# Patient Record
Sex: Male | Born: 1945 | Race: White | Hispanic: No | Marital: Single | Smoking: Never smoker
Health system: Southern US, Community
[De-identification: ages and names within clinical notes are randomized; demographics above are authoritative.]

## PROBLEM LIST (undated history)

## (undated) DIAGNOSIS — C801 Malignant (primary) neoplasm, unspecified: Secondary | ICD-10-CM

## (undated) DIAGNOSIS — J8410 Pulmonary fibrosis, unspecified: Secondary | ICD-10-CM

## (undated) DIAGNOSIS — M109 Gout, unspecified: Secondary | ICD-10-CM

## (undated) DIAGNOSIS — I2510 Atherosclerotic heart disease of native coronary artery without angina pectoris: Secondary | ICD-10-CM

## (undated) DIAGNOSIS — C099 Malignant neoplasm of tonsil, unspecified: Secondary | ICD-10-CM

## (undated) DIAGNOSIS — R1310 Dysphagia, unspecified: Secondary | ICD-10-CM

## (undated) DIAGNOSIS — K599 Functional intestinal disorder, unspecified: Secondary | ICD-10-CM

## (undated) HISTORY — PX: PEG TUBE PLACEMENT: SUR1034

## (undated) HISTORY — PX: ABDOMINAL AORTIC ENDOVASCULAR STENT GRAFT: SHX5707

## (undated) HISTORY — PX: CORONARY ARTERY ANGIOPLASTY: PR CATH30428

---

## 2009-02-21 ENCOUNTER — Emergency Department
Admit: 2009-02-21 | Discharge: 2009-02-21 | Disposition: A | Discharge status: Home / Self Care | Payer: Self-pay | Source: Ambulatory Visit | Attending: EXTERNAL | Admitting: EXTERNAL

## 2011-08-06 ENCOUNTER — Ambulatory Visit (INDEPENDENT_AMBULATORY_CARE_PROVIDER_SITE_OTHER): Payer: Self-pay | Admitting: Surgery

## 2011-08-27 ENCOUNTER — Ambulatory Visit (INDEPENDENT_AMBULATORY_CARE_PROVIDER_SITE_OTHER): Payer: 59 | Admitting: GENERAL SURGERY

## 2011-08-27 ENCOUNTER — Ambulatory Visit (INDEPENDENT_AMBULATORY_CARE_PROVIDER_SITE_OTHER): Payer: Self-pay | Admitting: Surgery

## 2011-08-27 NOTE — Progress Notes (Signed)
 This appointment was made on 08/06/11    The patient is an inmate at [facility]. He has a history of gout. He states that about four to five weeks ago both of his great toes were red, hot, and swollen at the PIP joints. He states he could feel pus in them. He has been on allopurinol since that time, and they have improved significantly over the past several days.     On exam, he has some mild swelling of the PIP joints bilaterally. There is no warmth, no fluctuance, no drainage. The areas are not tender to touch.     I explained to Jose Duran that gout often appears like an infection, and it appears that the allopurinol has done its job.     There is nothing to incise or drain. There appears to be no acute infection.     We will see the patient on an as needed basis. Total time with patient, 10 minutes

## 2018-07-02 ENCOUNTER — Encounter (HOSPITAL_COMMUNITY): Payer: Self-pay | Admitting: Emergency Medicine

## 2018-07-02 ENCOUNTER — Emergency Department (HOSPITAL_COMMUNITY)
Admission: EM | Admit: 2018-07-02 | Discharge: 2018-07-02 | Disposition: A | Discharge status: Home / Self Care | Payer: Medicare Other | Attending: Emergency Medicine | Admitting: Emergency Medicine

## 2018-07-02 DIAGNOSIS — Z931 Gastrostomy status: Secondary | ICD-10-CM | POA: Diagnosis not present

## 2018-07-02 DIAGNOSIS — R079 Chest pain, unspecified: Secondary | ICD-10-CM | POA: Insufficient documentation

## 2018-07-02 DIAGNOSIS — Y998 Other external cause status: Secondary | ICD-10-CM | POA: Insufficient documentation

## 2018-07-02 DIAGNOSIS — Y929 Unspecified place or not applicable: Secondary | ICD-10-CM | POA: Diagnosis not present

## 2018-07-02 DIAGNOSIS — Y939 Activity, unspecified: Secondary | ICD-10-CM | POA: Diagnosis not present

## 2018-07-02 DIAGNOSIS — Z5321 Procedure and treatment not carried out due to patient leaving prior to being seen by health care provider: Secondary | ICD-10-CM | POA: Insufficient documentation

## 2018-07-02 HISTORY — DX: Gout, unspecified: M10.9

## 2018-07-02 HISTORY — DX: Atherosclerotic heart disease of native coronary artery without angina pectoris: I25.10

## 2018-07-02 NOTE — ED Triage Notes (Signed)
Pt ambulatory and requesting to leave ... [employer] aware and giving him a citation, pt not in custody
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
## 2018-07-02 NOTE — ED Notes (Signed)
No answer x2 vitals and never answered for xray

## 2019-03-02 ENCOUNTER — Encounter (HOSPITAL_BASED_OUTPATIENT_CLINIC_OR_DEPARTMENT_OTHER): Payer: Self-pay

## 2019-03-02 ENCOUNTER — Emergency Department (HOSPITAL_BASED_OUTPATIENT_CLINIC_OR_DEPARTMENT_OTHER)
Admission: EM | Admit: 2019-03-02 | Discharge: 2019-03-02 | Disposition: A | Discharge status: Home / Self Care | Payer: Medicare HMO | Attending: Emergency Medicine | Admitting: Emergency Medicine

## 2019-03-02 ENCOUNTER — Other Ambulatory Visit: Payer: Self-pay

## 2019-03-02 DIAGNOSIS — Z7982 Long term (current) use of aspirin: Secondary | ICD-10-CM | POA: Insufficient documentation

## 2019-03-02 DIAGNOSIS — Z4659 Encounter for fitting and adjustment of other gastrointestinal appliance and device: Secondary | ICD-10-CM | POA: Insufficient documentation

## 2019-03-02 DIAGNOSIS — Z79899 Other long term (current) drug therapy: Secondary | ICD-10-CM | POA: Insufficient documentation

## 2019-03-02 HISTORY — DX: Malignant (primary) neoplasm, unspecified (CMS HCC): C80.1

## 2019-03-02 HISTORY — DX: Malignant (primary) neoplasm, unspecified: C80.1

## 2019-03-02 MED ORDER — CEPHALEXIN 500 MG CAPSULE
500.00 mg | ORAL_CAPSULE | Freq: Four times a day (QID) | ORAL | 0 refills | Status: AC
Start: 2019-03-02 — End: 2019-03-09

## 2019-03-02 MED ORDER — CEPHALEXIN 500 MG CAPSULE
500.00 mg | ORAL_CAPSULE | Freq: Four times a day (QID) | ORAL | 0 refills | Status: DC
Start: 2019-03-02 — End: 2019-03-02

## 2019-03-02 NOTE — ED Provider Notes (Signed)
Jose Duran, [facility]  Emergency Department Visit Note    Date:  03/02/2019  Primary care provider:  No Pcp  Means of arrival:  private car  History obtained from: patient  History limited by: none    Chief Complaint:  Feeding Tube Problem     HISTORY OF PRESENT ILLNESS     Jose Duran, date of birth February 28, 1946, is a 73 y.o. male who presents to the Emergency Department complaining of a feeding tube problem with an onset of day prior to arrival. Patient reports the tube had fallen out and has concern for infection to the surrounding skin. Patient notes he has had feeding tube placement for years and uses the tube daily. He denies measured fevers, chest pain, shortness of breath, abdominal pain, vomiting and nausea.     REVIEW OF SYSTEMS     The pertinent positive and negative symptoms are as per HPI. All other systems reviewed and are negative.     PATIENT HISTORY     Past Medical History:  Past Medical History:   Diagnosis Date   . Cancer (CMS [facility])        Past Surgical History:  Past Surgical History:   Procedure Laterality Date   . Coronary artery angioplasty         Family History:  No history of acute family illness given at this time.     Social History:  Social History     Tobacco Use   . Smoking status: Never Smoker   . Smokeless tobacco: Never Used   Substance Use Topics   . Alcohol use: Not Currently   . Drug use: Not Currently     Social History     Substance and Sexual Activity   Drug Use Not Currently       Medications:  Current Outpatient Medications   Medication Sig   . ALLOPURINOL ORAL Take by mouth   . aspirin 81 mg Oral Tablet, Chewable Take 81 mg by mouth Once a day   . esomeprazole magnesium (NEXIUM ORAL) Take by mouth       Allergies:  No Known Allergies    PHYSICAL EXAM     Vitals:  Filed Vitals:    03/02/19 1403   BP: 122/68   Pulse: 77   Resp: 18   Temp: 37.1 C (98.7 F)   SpO2: 97%       Pulse ox  97% on None (Room Air) interpreted by me as:  Normal    Constitutional: The patient is in no acute distress.  Head: Atraumatic, normocephalic  Eyes: Pupils equal and round, reactive to light and accomodation. Extraocular muscles intact bilaterally, conjunctiva and sclera clear  Oropharynx: Moist mucous membranes, no lesions, erythema or exudate noted.  Neck: Soft, supple, no palpatory masses, lymphadenopathy, carotid bruits, tenderness or JVD.  Chest: Atraumatic, no tenderness to palpation, clear and equal breath sounds bilaterally. No wheezes, rhonchi or rales.  No accessory muscle use.  Cardiovascular: Heart is S1-S2, regular rate and rhythm without murmur, click, gallop or rub.  Abdomen: G Tube Ostomy with mild prolapse of underlying tissues. Soft, non-tender, non-distended without evidence of rebound or guarding, active bowel sound in four quadrants, no hepatomegaly, splenomegaly or other palpatory masses.   Skin: No cyanosis, jaundice, rash, lesion or edema.  Neurologic: Alert, oriented, muscle strength 5/5 bilateral upper and lower extremities, sensation equal in bilateral upper and lower extremities. No facial asymmetry. No slurred speech.  Musculoskeletal:  no deformities, trauma, edema, no midline or paraspinal muscle tenderness to palpation. No step-off in midline spine.  Vascular: Peripheral pulses 2/4 with brisk capillary refills of less than 3 seconds.    DIAGNOSTIC STUDIES     Procedures:     Encounter Date: 03/02/2019     Emergency Department Procedure:    Gastrostomy Tube Change  Procedure performed at: 8676  Documentation: Area around the cystostomy was prepped, Balloon tested and was intact, Catheter was lubricated, Inserted through the gastrostomy with, no difficulty, Gastric fluid came back through the catheter, Catheter could be irrigated easily  Tube type: G tube with bolster  Tube size: 20 fr    ED PROGRESS NOTE / [place] records reviewed by me:  I have reviewed the nurse's notes. I have reviewed the patient's  problem list and pertinent past medical records.     No orders of the defined types were placed in this encounter.       15:25: Initial evaluation is complete at this time. Plan will be to replace the G Tube and follow up with general surgery. Patient is agreeable with the treatment plan at this time.      15:38: At bedside for feeding tube placement.     15:46: Gastrostomy tube placed at bedside without complication or difficulty. Able to flush and aspirate without difficulty. See procedure note.     15:48: On recheck, the patient is in no acute distress.  I encouraged follow-up with his general surgeon back home because he does have a mild prolapse around his ostomy site.  Patient is to follow up with Surgeon and Family Doctor as soon as possible. I discussed the diagnosis, disposition, and follow-up plan. The patient understood and is in accordance with the treatment plan at this time. Patient is to return here to the Emergency Department if new or worsening symptoms appear. All of their questions have been answered to their satisfaction. The patient is in stable condition at the time of discharge.     MIPS     Not applicable     OPIATE PRESCRIPTION      Not applicable    CORE MEASURES     Not applicable    CRITICAL CARE TIME     Not applicable     PRE-DISPOSITION VITALS        Pre-Disposition Vitals:  Filed Vitals:    03/02/19 1403   BP: 122/68   Pulse: 77   Resp: 18   Temp: 37.1 C (98.7 F)   SpO2: 97%       CLINICAL IMPRESSION     Encounter Diagnosis   Name Primary?   . Encounter for feeding tube placement Yes       DISPOSITION/PLAN     Discharged        Follow-Up:     Your surgeon and family doctor      Call as soon as possible for follow up in 2 days      Condition at Disposition: Stable        SCRIBE Acequia. Detrick, SCRIBE scribed for Natalia Leatherwood, DO on 03/02/2019 at 3:21 PM.     Documentation assistance provided for Jose Duran, Zella Richer, DO  by [employer] [place], [place].  Information recorded by the scribe was done at my direction and has been reviewed and validated by me Jose Duran, Zella Richer, DO.

## 2019-03-02 NOTE — Discharge Instructions (Signed)
Return to ED, as we discussed, with any worsening symptoms, new concerns or inability to follow up as instructed in chart.

## 2019-03-02 NOTE — ED Triage Notes (Signed)
Patient states feeding tube came out this am.  Needs replaced.

## 2019-03-02 NOTE — ED Nurses Note (Signed)
Patient discharged home with family.  AVS reviewed with patient/care giver.  A written copy of the AVS and discharge instructions was given to the patient/care giver.  Questions sufficiently answered as needed.  Patient/care giver encouraged to follow up with PCP as indicated.  In the event of an emergency, patient/care giver instructed to call 911 or go to the nearest emergency room.

## 2020-08-13 ENCOUNTER — Encounter (HOSPITAL_BASED_OUTPATIENT_CLINIC_OR_DEPARTMENT_OTHER): Payer: Self-pay | Admitting: Emergency Medicine

## 2020-08-13 ENCOUNTER — Other Ambulatory Visit: Payer: Self-pay

## 2020-08-13 ENCOUNTER — Emergency Department (HOSPITAL_BASED_OUTPATIENT_CLINIC_OR_DEPARTMENT_OTHER)
Admission: EM | Admit: 2020-08-13 | Discharge: 2020-08-13 | Disposition: A | Discharge status: Home / Self Care | Payer: Medicare Other | Attending: Emergency Medicine | Admitting: Emergency Medicine

## 2020-08-13 DIAGNOSIS — L03114 Cellulitis of left upper limb: Secondary | ICD-10-CM | POA: Diagnosis not present

## 2020-08-13 DIAGNOSIS — I2510 Atherosclerotic heart disease of native coronary artery without angina pectoris: Secondary | ICD-10-CM | POA: Insufficient documentation

## 2020-08-13 DIAGNOSIS — M79642 Pain in left hand: Secondary | ICD-10-CM | POA: Diagnosis present

## 2020-08-13 MED ORDER — DOXYCYCLINE HYCLATE 100 MG PO CAPS: 100.0000 mg | ORAL_CAPSULE | Freq: Two times a day (BID) | ORAL | 0 refills | Status: DC

## 2020-08-13 MED ORDER — DOXYCYCLINE HYCLATE 100 MG PO TABS
100.0000 mg | ORAL_TABLET | Freq: Once | ORAL | Status: AC
  Administered 2020-08-13: 100 mg via ORAL
  Filled 2020-08-13: qty 1

## 2020-08-13 MED ORDER — ALLOPURINOL 300 MG PO TABS: 300.0000 mg | ORAL_TABLET | Freq: Every day | ORAL | 0 refills | Status: DC
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
## 2020-08-13 NOTE — ED Notes (Signed)
Doxycycline given through Gtube because patient is NPO

## 2020-08-13 NOTE — ED Provider Notes (Signed)
[facility] EMERGENCY DEPARTMENT Provider Note   CSN: [id number] Arrival date & time: 08/13/20  0340     History Chief Complaint  Patient presents with  . Arm Pain    Marc Rubio is a 74 y.o. male.  74 yo M with a chief complaints of left hand pain.  This been going on for a few days now.  Patient states that he hurt it trying to get something out of an SUV.  Had been seen at an outside hospital and had a plain film performed as an order through triage that was negative for fracture.  He then left prior to seeing a provider.  Since then he does not feel like his symptoms of gotten much better or worse.  Denies repeat trauma.  Pain worse to the dorsum of the hand.  Able to range the wrist somewhat.  He has been applying ice and cold water off and on.  Has tried Epson salt soaks as well.  The history is provided by the patient.  Arm Pain This is a new problem. The current episode started more than 2 days ago. The problem occurs constantly. The problem has not changed since onset.Pertinent negatives include no chest pain, no abdominal pain, no headaches and no shortness of breath. Nothing aggravates the symptoms. Nothing relieves the symptoms. He has tried nothing for the symptoms. The treatment provided no relief.       Past Medical History:  Diagnosis Date  . Coronary artery disease   . Gout     There are no problems to display for this patient.   Past Surgical History:  Procedure Laterality Date  . PEG TUBE PLACEMENT         Family History  Problem Relation Age of Onset  . Cancer Sister   . Cancer Brother     Social History   Tobacco Use  . Smoking status: Never Smoker  . Smokeless tobacco: Never Used  Vaping Use  . Vaping Use: Never used  Substance Use Topics  . Alcohol use: Not Currently  . Drug use: Not Currently    Home Medications Prior to Admission medications   Medication Sig Start Date End Date Taking? Authorizing Provider   allopurinol (ZYLOPRIM) 300 MG tablet Take 1 tablet (300 mg total) by mouth daily. 08/13/20   Deno Etienne, DO  doxycycline (VIBRAMYCIN) 100 MG capsule Take 1 capsule (100 mg total) by mouth 2 (two) times daily. One po bid x 7 days 08/13/20   Deno Etienne, DO    Allergies    Patient has no known allergies.  Review of Systems   Review of Systems  Constitutional: Negative for chills and fever.  HENT: Negative for congestion and facial swelling.   Eyes: Negative for discharge and visual disturbance.  Respiratory: Negative for shortness of breath.   Cardiovascular: Negative for chest pain and palpitations.  Gastrointestinal: Negative for abdominal pain, diarrhea and vomiting.  Musculoskeletal: Positive for myalgias. Negative for arthralgias.  Skin: Positive for color change. Negative for rash.  Neurological: Negative for tremors, syncope and headaches.  Psychiatric/Behavioral: Negative for confusion and dysphoric mood.    Physical Exam Updated Vital Signs BP (!) 125/59 (BP Location: Right Arm)   Pulse 87   Temp 98.3 F (36.8 C) (Oral)   Resp 20   Ht 6' (1.829 m)   Wt 79.4 kg   SpO2 100%   BMI 23.73 kg/m   Physical Exam Vitals and nursing note reviewed.  Constitutional:  Appearance: He is well-developed.  HENT:     Head: Normocephalic and atraumatic.  Eyes:     Pupils: Pupils are equal, round, and reactive to light.  Neck:     Vascular: No JVD.  Cardiovascular:     Rate and Rhythm: Normal rate and regular rhythm.     Heart sounds: No murmur heard.  No friction rub. No gallop.   Pulmonary:     Effort: No respiratory distress.     Breath sounds: No wheezing.  Abdominal:     General: There is no distension.     Tenderness: There is no guarding or rebound.  Musculoskeletal:        General: Swelling and tenderness present. Normal range of motion.     Cervical back: Normal range of motion and neck supple.     Comments: Pain and swelling to the dorsum of the left hand.  No  focal area of fluctuance or induration.  Able to range the wrist without significant discomfort.  Warm to touch.  Pulse motor and sensation intact distally.  Skin:    Coloration: Skin is not pale.     Findings: No rash.  Neurological:     Mental Status: He is alert and oriented to person, place, and time.  Psychiatric:        Behavior: Behavior normal.     ED Results / Procedures / Treatments   Labs (all labs ordered are listed, but only abnormal results are displayed) Labs Reviewed - No data to display  EKG None  Radiology No results found.  Procedures Procedures (including critical care time)  Medications Ordered in ED Medications  doxycycline (VIBRA-TABS) tablet 100 mg (has no administration in time range)    ED Course  I have reviewed the triage vital signs and the nursing notes.  Pertinent labs & imaging results that were available during my care of the patient were reviewed by me and considered in my medical decision making (see chart for details).    MDM Rules/Calculators/A&P                          74 yo M with a chief complaints of left hand pain.  Going on for a few days now.  Has been seen at an outside hospital with a negative plain film.  Septic arthritis is less likely as the patient has no significant pain at the wrist.  Seems focally along the dorsum of the hand.  Will start on antibiotics.  Splint for comfort.  PCP follow-up.  4:13 AM:  I have discussed the diagnosis/risks/treatment options with the patient and believe the pt to be eligible for discharge home to follow-up with PCP. We also discussed returning to the ED immediately if new or worsening sx occur. We discussed the sx which are most concerning (e.g., sudden worsening pain, fever, inability to tolerate by mouth) that necessitate immediate return. Medications administered to the patient during their visit and any new prescriptions provided to the patient are listed below.  Medications given  during this visit Medications  doxycycline (VIBRA-TABS) tablet 100 mg (has no administration in time range)     The patient appears reasonably screen and/or stabilized for discharge and I doubt any other medical condition or other [facility] requiring further screening, evaluation, or treatment in the ED at this time prior to discharge.   Final Clinical Impression(s) / ED Diagnoses Final diagnoses:  Cellulitis of left upper extremity    Rx /  DC Orders ED Discharge Orders         Ordered    doxycycline (VIBRAMYCIN) 100 MG capsule  2 times daily        08/13/20 0410    allopurinol (ZYLOPRIM) 300 MG tablet  Daily        08/13/20 0410           Deno Etienne, DO 08/13/20 0413

## 2020-08-13 NOTE — Discharge Instructions (Signed)
Warm compresses at least 4 times a day.  Return for rapid spreading redness or fever.

## 2020-11-25 ENCOUNTER — Encounter (HOSPITAL_BASED_OUTPATIENT_CLINIC_OR_DEPARTMENT_OTHER): Payer: Self-pay

## 2020-11-25 ENCOUNTER — Other Ambulatory Visit: Payer: Self-pay

## 2020-11-25 ENCOUNTER — Emergency Department (HOSPITAL_BASED_OUTPATIENT_CLINIC_OR_DEPARTMENT_OTHER): Payer: Medicare Other

## 2020-11-25 ENCOUNTER — Emergency Department (HOSPITAL_BASED_OUTPATIENT_CLINIC_OR_DEPARTMENT_OTHER)
Admission: EM | Admit: 2020-11-25 | Discharge: 2020-11-25 | Disposition: A | Discharge status: Home / Self Care | Payer: Medicare Other | Attending: Emergency Medicine | Admitting: Emergency Medicine

## 2020-11-25 DIAGNOSIS — Z20822 Contact with and (suspected) exposure to covid-19: Secondary | ICD-10-CM | POA: Diagnosis not present

## 2020-11-25 DIAGNOSIS — M109 Gout, unspecified: Secondary | ICD-10-CM | POA: Insufficient documentation

## 2020-11-25 DIAGNOSIS — I2510 Atherosclerotic heart disease of native coronary artery without angina pectoris: Secondary | ICD-10-CM | POA: Diagnosis not present

## 2020-11-25 DIAGNOSIS — R600 Localized edema: Secondary | ICD-10-CM | POA: Diagnosis not present

## 2020-11-25 DIAGNOSIS — R0602 Shortness of breath: Secondary | ICD-10-CM | POA: Diagnosis present

## 2020-11-25 DIAGNOSIS — J40 Bronchitis, not specified as acute or chronic: Secondary | ICD-10-CM | POA: Insufficient documentation

## 2020-11-25 LAB — COMPREHENSIVE METABOLIC PANEL
ALT: 85 U/L — ABNORMAL HIGH (ref 0–44)
AST: 46 U/L — ABNORMAL HIGH (ref 15–41)
Albumin: 2.7 g/dL — ABNORMAL LOW (ref 3.5–5.0)
Alkaline Phosphatase: 97 U/L (ref 38–126)
Anion gap: 10 (ref 5–15)
BUN: 27 mg/dL — ABNORMAL HIGH (ref 8–23)
CO2: 27 mmol/L (ref 22–32)
Calcium: 8.7 mg/dL — ABNORMAL LOW (ref 8.9–10.3)
Chloride: 100 mmol/L (ref 98–111)
Creatinine, Ser: 0.9 mg/dL (ref 0.61–1.24)
GFR, Estimated: >60 mL/min (ref >60)
Glucose, Bld: 107 mg/dL — ABNORMAL HIGH (ref 70–99)
Potassium: 3.9 mmol/L (ref 3.5–5.1)
Sodium: 137 mmol/L (ref 135–145)
Total Bilirubin: 0.2 mg/dL — ABNORMAL LOW (ref 0.3–1.2)
Total Protein: 7.7 g/dL (ref 6.5–8.1)

## 2020-11-25 LAB — URINALYSIS, ROUTINE W REFLEX MICROSCOPIC
Bilirubin Urine: NEGATIVE
Glucose, UA: NEGATIVE mg/dL
Hgb urine dipstick: NEGATIVE
Ketones, ur: NEGATIVE mg/dL
Leukocytes,Ua: NEGATIVE
Nitrite: NEGATIVE
Protein, ur: NEGATIVE mg/dL
Specific Gravity, Urine: 1.02 (ref 1.005–1.030)
pH: 6 (ref 5.0–8.0)

## 2020-11-25 LAB — CBC WITH DIFFERENTIAL/PLATELET
Abs Immature Granulocytes: 0.11 10*3/uL — ABNORMAL HIGH (ref 0.00–0.07)
Basophils Absolute: 0 10*3/uL (ref 0.0–0.1)
Basophils Relative: 0 %
Eosinophils Absolute: 0.1 10*3/uL (ref 0.0–0.5)
Eosinophils Relative: 1 %
HCT: 36 % — ABNORMAL LOW (ref 39.0–52.0)
Hemoglobin: 11.5 g/dL — ABNORMAL LOW (ref 13.0–17.0)
Immature Granulocytes: 1 %
Lymphocytes Relative: 9 %
Lymphs Abs: 0.8 10*3/uL (ref 0.7–4.0)
MCH: 31.5 pg (ref 26.0–34.0)
MCHC: 31.9 g/dL (ref 30.0–36.0)
MCV: 98.6 fL (ref 80.0–100.0)
Monocytes Absolute: 1.1 10*3/uL — ABNORMAL HIGH (ref 0.1–1.0)
Monocytes Relative: 13 %
Neutro Abs: 6 10*3/uL (ref 1.7–7.7)
Neutrophils Relative %: 76 %
Platelets: 704 10*3/uL — ABNORMAL HIGH (ref 150–400)
RBC: 3.65 MIL/uL — ABNORMAL LOW (ref 4.22–5.81)
RDW: 14.2 % (ref 11.5–15.5)
WBC: 8 10*3/uL (ref 4.0–10.5)
nRBC: 0 % (ref 0.0–0.2)

## 2020-11-25 LAB — RESP PANEL BY RT-PCR (FLU A&B, COVID) ARPGX2
Influenza A by PCR: NEGATIVE
Influenza B by PCR: NEGATIVE
SARS Coronavirus 2 by RT PCR: NEGATIVE

## 2020-11-25 LAB — D-DIMER, QUANTITATIVE: D-Dimer, Quant: 2.44 ug/mL-FEU — ABNORMAL HIGH (ref 0.00–0.50)

## 2020-11-25 LAB — TROPONIN I (HIGH SENSITIVITY): Troponin I (High Sensitivity): 4 ng/L (ref <18)

## 2020-11-25 LAB — BRAIN NATRIURETIC PEPTIDE: B Natriuretic Peptide: 62.7 pg/mL (ref 0.0–100.0)

## 2020-11-25 MED ORDER — METHYLPREDNISOLONE 4 MG PO TBPK: ORAL_TABLET | ORAL | 0 refills | Status: DC

## 2020-11-25 MED ORDER — PREDNISONE 20 MG PO TABS: 40.0000 mg | ORAL_TABLET | Freq: Every day | ORAL | 0 refills | Status: DC

## 2020-11-25 MED ORDER — IOHEXOL 350 MG/ML SOLN
100.0000 mL | Freq: Once | INTRAVENOUS | Status: AC | PRN
  Administered 2020-11-25: 100 mL via INTRAVENOUS

## 2020-11-25 MED ORDER — COLCHICINE 0.6 MG PO TABS: 0.6000 mg | ORAL_TABLET | Freq: Every day | ORAL | 0 refills | Status: DC

## 2020-11-25 MED ORDER — DOXYCYCLINE HYCLATE 100 MG PO CAPS: 100.0000 mg | ORAL_CAPSULE | Freq: Two times a day (BID) | ORAL | 0 refills | Status: DC

## 2020-11-25 NOTE — ED Notes (Signed)
Patient transported to CT 

## 2020-11-25 NOTE — ED Notes (Signed)
ED Provider at bedside. 

## 2020-11-25 NOTE — ED Provider Notes (Signed)
[place] EMERGENCY DEPARTMENT Provider Note   CSN: [id number] Arrival date & time: 11/25/20  1510     History Chief Complaint  Patient presents with  . Shortness of Breath    Marc Rubio is a 75 y.o. male.  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  He states that he feels like he got "hit by a truck".  He is not vaccinated for COVID.  Denies cough.  Denies any dysuria or hematuria.  He also complains of pain to his left wrist, which has been ongoing since December.  Prior chart review, patient was seen over [facility] after he injured it while pulling furniture, had a unremarkable x-ray.  He was also seen here at [facility] and was thought to have some cellulitis, given antibiotics.  Patient was compliant with these but states that his wrist has continued to be swollen and he does not have the ability to flex it.  States he does have a history of gout but it is usually in his feet.  He denies any vomiting, chest pain, dizziness.  He states the headaches come and go, no word slurring, facial droop.  He is not vaccinated for COVID-19.    Past Medical History:  Diagnosis Date  . Coronary artery disease   . Gout     There are no problems to display for this patient.   Past Surgical History:  Procedure Laterality Date  . PEG TUBE PLACEMENT         Family History  Problem Relation Age of Onset  . Cancer Sister   . Cancer Brother     Social History   Tobacco Use  . Smoking status: Never Smoker  . Smokeless tobacco: Never Used  Vaping Use  . Vaping Use: Never used  Substance Use Topics  . Alcohol use: Not Currently  . Drug use: Not Currently    Home Medications Prior to Admission medications   Medication Sig  Start Date End Date Taking? Authorizing Provider  colchicine 0.6 MG tablet Take 1 tablet (0.6 mg total) by mouth daily. 11/25/20 12/25/20 Yes Garald Balding, PA-C  doxycycline (VIBRAMYCIN) 100 MG capsule Take 1 capsule (100 mg total) by mouth 2 (two) times daily. 11/25/20  Yes Garald Balding, PA-C  methylPREDNISolone (MEDROL DOSEPAK) 4 MG TBPK tablet Take as directed until finished 11/25/20  Yes Claritza July A, PA-C  allopurinol (ZYLOPRIM) 300 MG tablet Take 1 tablet (300 mg total) by mouth daily. 08/13/20   Deno Etienne, DO    Allergies    Patient has no known allergies.  Review of Systems   Review of Systems  Constitutional: Positive for fatigue and fever. Negative for chills.  HENT: Negative for ear pain and sore throat.   Eyes: Negative for pain and visual disturbance.  Respiratory: Positive for shortness of breath. Negative for cough.   Cardiovascular: Negative for chest pain and palpitations.  Gastrointestinal: Positive for nausea. Negative for abdominal pain and vomiting.  Genitourinary: Negative for dysuria and hematuria.  Musculoskeletal: Negative for arthralgias and back pain.  Skin: Negative for color change and rash.  Neurological: Negative for seizures and syncope.  All other systems reviewed and are negative.   Physical Exam Updated Vital Signs BP 122/73   Pulse 89  Temp 98.3 F (36.8 C) (Oral)   Resp (!) 30   Ht 6' (1.829 m)   Wt 79.4 kg   SpO2 99%   BMI 23.73 kg/m   Physical Exam Vitals and nursing note reviewed.  Constitutional:      General: He is not in acute distress.    Appearance: He is well-developed. He is not ill-appearing, toxic-appearing or diaphoretic.  HENT:     Head: Normocephalic and atraumatic.  Eyes:     Conjunctiva/sclera: Conjunctivae normal.  Cardiovascular:     Rate and Rhythm: Normal rate and regular rhythm.     Heart sounds: No murmur heard.   Pulmonary:     Effort: Pulmonary effort is normal. No respiratory distress.      Breath sounds: Normal breath sounds. No decreased breath sounds or wheezing.  Chest:     Chest wall: No tenderness.  Abdominal:     Palpations: Abdomen is soft.     Tenderness: There is no abdominal tenderness.     Comments: Significantly distended abdomen, however nontender.  PEG tube in place, no surrounding erythema, drainage, warmth.  No flank tenderness.  Musculoskeletal:     Cervical back: Neck supple.     Right lower leg: Edema present.     Left lower leg: Edema present.     Comments: Trace edema to lower extremities bilaterally.  Left wrist with moderate edema, mildly warm to touch.  No overlying erythema, warmth.  Limited flexion and extension secondary to pain.  2+ radial pulses.<2 cap refill  Skin:    General: Skin is warm and dry.  Neurological:     General: No focal deficit present.     Mental Status: He is alert.  Psychiatric:        Mood and Affect: Mood normal.        Behavior: Behavior normal.     ED Results / Procedures / Treatments   Labs (all labs ordered are listed, but only abnormal results are displayed) Labs Reviewed  CBC WITH DIFFERENTIAL/PLATELET - Abnormal; Notable for the following components:      Result Value   RBC 3.65 (*)    Hemoglobin 11.5 (*)    HCT 36.0 (*)    Platelets 704 (*)    Monocytes Absolute 1.1 (*)    Abs Immature Granulocytes 0.11 (*)    All other components within normal limits  COMPREHENSIVE METABOLIC PANEL - Abnormal; Notable for the following components:   Glucose, Bld 107 (*)    BUN 27 (*)    Calcium 8.7 (*)    Albumin 2.7 (*)    AST 46 (*)    ALT 85 (*)    Total Bilirubin 0.2 (*)    All other components within normal limits  D-DIMER, QUANTITATIVE - Abnormal; Notable for the following components:   D-Dimer, Quant 2.44 (*)    All other components within normal limits  RESP PANEL BY RT-PCR (FLU A&B, COVID) ARPGX2  BRAIN NATRIURETIC PEPTIDE  URINALYSIS, ROUTINE W REFLEX MICROSCOPIC  TROPONIN I (HIGH SENSITIVITY)     EKG EKG Interpretation  Date/Time:  Monday November 25 2020 15:30:49 EDT Ventricular Rate:  97 PR Interval:  134 QRS Duration: 86 QT Interval:  364 QTC Calculation: 462 R Axis:   4 Text Interpretation: Normal sinus rhythm Cannot rule out Anterior infarct , age undetermined Abnormal ECG No significant change since last tracing Confirmed by Calvert Cantor [phone]) on 11/25/2020 3:36:59 PM   Radiology DG Abdomen 1 View  Result Date:  11/25/2020 CLINICAL DATA:  Shortness of breath, fever, nausea and intermittent headache for 2 weeks EXAM: ABDOMEN - 1 VIEW COMPARISON:  07/17/2017 Correlation: CT abdomen pelvis 08/11/2017 FINDINGS: Gastrostomy tube in LEFT upper quadrant. Colonic dilatation involving the cecum through transverse colon, measuring up to 14.6 cm at proximal ascending colon; previously this measured 14.8 cm in 2018. Small amount of gas within rectum and sigmoid colon, which appear decompressed. No small bowel distension. Stents identified within abdominal aorta and common iliac arteries. Osseous demineralization with degenerative disc disease changes of thoracolumbar spine and chronic appearing compression deformity of L4 vertebral body. IMPRESSION: Colonic distension of cecum through transverse colon, measuring up to 14.6 cm at proximal ascending colon, not significantly changed from 07/17/2017 exam; the colon was decompressed on an interval CT study from 2019. This likely reflects colonic ileus since no obstructing colonic lesion was seen on the interval CT exam, which showed a decompressed colon. Electronically Signed   By: Lavonia Dana M.D.   On: 11/25/2020 18:43   CT Angio Chest PE W and/or Wo Contrast  Result Date: 11/25/2020 CLINICAL DATA:  Shortness of breath, fever EXAM: CT ANGIOGRAPHY CHEST WITH CONTRAST TECHNIQUE: Multidetector CT imaging of the chest was performed using the standard protocol during bolus administration of intravenous contrast. Multiplanar CT image  reconstructions and MIPs were obtained to evaluate the vascular anatomy. CONTRAST:  153mL OMNIPAQUE IOHEXOL 350 MG/ML SOLN COMPARISON:  07/11/2017 FINDINGS: Cardiovascular: No filling defects in the pulmonary arteries to suggest pulmonary emboli. Heart is normal size. Aorta is normal caliber. Coronary artery and aortic calcifications. Mediastinum/Nodes: No mediastinal, hilar, or axillary adenopathy. Trachea and esophagus are unremarkable. Thyroid unremarkable. Lungs/Pleura: Peripheral interstitial thickening compatible with fibrosis, most notable in the lung bases. No confluent opacities or effusions. Upper Abdomen: Imaging into the upper abdomen demonstrates no acute findings. Gastrostomy tube within the stomach. Musculoskeletal: Chest wall soft tissues are unremarkable. No acute bony abnormality. Review of the MIP images confirms the above findings. IMPRESSION: No evidence of pulmonary embolus. Peripheral interstitial thickening/fibrosis. Coronary artery disease. Aortic Atherosclerosis (ICD10-I70.0). Electronically Signed   By: Rolm Baptise M.D.   On: 11/25/2020 19:56   DG Chest Portable 1 View  Result Date: 11/25/2020 CLINICAL DATA:  Shortness of breath EXAM: PORTABLE CHEST 1 VIEW COMPARISON:  None. FINDINGS: The heart size and mediastinal contours are within normal limits. Hazy airspace opacity is seen at the left lung base. Aortic knob calcifications are seen. The visualized skeletal structures are unremarkable. IMPRESSION: Hazy airspace opacity at the left lung base which may be due to atelectasis or infectious etiology. Electronically Signed   By: Prudencio Pair M.D.   On: 11/25/2020 17:39    Procedures Procedures   Medications Ordered in ED Medications  iohexol (OMNIPAQUE) 350 MG/ML injection 100 mL (100 mLs Intravenous Contrast Given 11/25/20 1926)    ED Course  I have reviewed the triage vital signs and the nursing notes.  Pertinent labs & imaging results that were available during my care  of the patient were reviewed by me and considered in my medical decision making (see chart for details).    MDM Rules/Calculators/A&P                          75 year old male presents to the ER with complaints of intermittent fevers, weakness, shortness of breath x2 weeks.  On arrival, he is chronically ill-appearing, however in no acute distress, resting comfortably in the ER bed.  Vitals on arrival  overall reassuring, no evidence of sepsis.  Physical exam was significantly distended abdomen, however nontender.  No flank tenderness.  No evidence infection to the PEG tube.  Left wrist is visibly swollen, mildly warm to touch, limited flexion extension secondary to pain.  Patient states this has been largely unchanged for the last 2 months.  Lower suspicion for septic joint secondary to this.  Question possible gout flare, however this has been ongoing for quite some time.  Lab work overall reassuring, he has a CBC without a leukocytosis, hemoglobin 11.5 which appears to be stable.  CMP with mild transaminitis of 46 and 85 respectively, no significant lecture normalities, normal renal function.  Initial troponin of 4.  D-dimer elevated at 2.44.  BNP of 62.7.  Covid and flu are negative.  Chest x-ray with questionable atelectasis versus infection.  Plain films of the abdomen with evidence of colonic ileus, however largely unchanged from 2018.  Patient does not have any abdominal pain or tenderness at this time.  EKG normal sinus rhythm.  Given elevated D-dimer, CT angio is pending.  I suspect if this is normal, he could be treated for pneumonia, possibly give steroids for gout exacerbation.  Patient states that he is in between PCP providers, would benefit from further follow-up with PCP.  Pt is he does not think he can urinate, refusing in and out cath.   CT angio without evidence of PE.  Possible peripheral nerve social thickening/fibrosis.  Overall work-up reassuring.  No evidence of sepsis.  Covid  is negative.  Questionable bronchitis.  Patient does have mildly elevated LFTs, he was encouraged to follow-up with a PCP to have this rechecked.  I reached out to our transition of care team to help establish with a new PCP for him.  As per discussion with Dr. Karle Starch who also saw and evaluated the patient, will send home with a steroid Dosepak and a course of doxycycline for bronchitis.  I personally spoke with transition of care who will arrange follow-up for him.  They will place appointment in the AVS.   Return precautions discussed.  Stable for discharge at this time  Final Clinical Impression(s) / ED Diagnoses Final diagnoses:  Bronchitis  Acute gout of left wrist, unspecified cause    Rx / DC Orders ED Discharge Orders         Ordered    predniSONE (DELTASONE) 20 MG tablet  Daily with breakfast,   Status:  Discontinued        11/25/20 1954    colchicine 0.6 MG tablet  Daily        11/25/20 1954    methylPREDNISolone (MEDROL DOSEPAK) 4 MG TBPK tablet        11/25/20 2000    doxycycline (VIBRAMYCIN) 100 MG capsule  2 times daily        11/25/20 2000           Lyndel Safe 11/25/20 2008    Truddie Hidden, MD 11/25/20 2159

## 2020-11-25 NOTE — ED Triage Notes (Signed)
Pt presents with complaints of SOB, fever, nausea and intermittent headache x 2 weeks. Also reports L wrist pain and swelling x 2 months.

## 2020-11-25 NOTE — Discharge Instructions (Addendum)
Your work-up today was overall reassuring. Please take the steroids and antibiotics as directed until finished.  You will need to make sure you follow-up with a primary care doctor to have your liver function tests rechecked.  Please return to the ER for any new or worsening symptoms.

## 2020-11-25 NOTE — ED Notes (Signed)
Pt unable to provide urine sample and refuses cath.

## 2021-01-04 ENCOUNTER — Other Ambulatory Visit: Payer: Self-pay

## 2021-01-04 ENCOUNTER — Inpatient Hospital Stay (HOSPITAL_BASED_OUTPATIENT_CLINIC_OR_DEPARTMENT_OTHER)
Admission: EM | Admit: 2021-01-04 | Discharge: 2021-01-23 | DRG: 871 | Disposition: A | Discharge status: Skilled Nursing Facility | Payer: Medicare Other | Attending: Internal Medicine | Admitting: Internal Medicine

## 2021-01-04 ENCOUNTER — Emergency Department (HOSPITAL_BASED_OUTPATIENT_CLINIC_OR_DEPARTMENT_OTHER): Payer: Medicare Other

## 2021-01-04 ENCOUNTER — Encounter (HOSPITAL_BASED_OUTPATIENT_CLINIC_OR_DEPARTMENT_OTHER): Payer: Self-pay | Admitting: Emergency Medicine

## 2021-01-04 DIAGNOSIS — Z9221 Personal history of antineoplastic chemotherapy: Secondary | ICD-10-CM

## 2021-01-04 DIAGNOSIS — Z860100 Personal history of colon polyps, unspecified: Secondary | ICD-10-CM

## 2021-01-04 DIAGNOSIS — M545 Low back pain, unspecified: Secondary | ICD-10-CM | POA: Diagnosis present

## 2021-01-04 DIAGNOSIS — I2510 Atherosclerotic heart disease of native coronary artery without angina pectoris: Secondary | ICD-10-CM | POA: Diagnosis present

## 2021-01-04 DIAGNOSIS — R52 Pain, unspecified: Secondary | ICD-10-CM

## 2021-01-04 DIAGNOSIS — D509 Iron deficiency anemia, unspecified: Secondary | ICD-10-CM | POA: Diagnosis present

## 2021-01-04 DIAGNOSIS — R682 Dry mouth, unspecified: Secondary | ICD-10-CM | POA: Diagnosis present

## 2021-01-04 DIAGNOSIS — B999 Unspecified infectious disease: Secondary | ICD-10-CM | POA: Diagnosis not present

## 2021-01-04 DIAGNOSIS — K219 Gastro-esophageal reflux disease without esophagitis: Secondary | ICD-10-CM | POA: Diagnosis present

## 2021-01-04 DIAGNOSIS — R933 Abnormal findings on diagnostic imaging of other parts of digestive tract: Secondary | ICD-10-CM

## 2021-01-04 DIAGNOSIS — K567 Ileus, unspecified: Secondary | ICD-10-CM | POA: Diagnosis present

## 2021-01-04 DIAGNOSIS — Z7982 Long term (current) use of aspirin: Secondary | ICD-10-CM

## 2021-01-04 DIAGNOSIS — Z85818 Personal history of malignant neoplasm of other sites of lip, oral cavity, and pharynx: Secondary | ICD-10-CM

## 2021-01-04 DIAGNOSIS — E119 Type 2 diabetes mellitus without complications: Secondary | ICD-10-CM

## 2021-01-04 DIAGNOSIS — A408 Other streptococcal sepsis: Secondary | ICD-10-CM | POA: Diagnosis not present

## 2021-01-04 DIAGNOSIS — Z931 Gastrostomy status: Secondary | ICD-10-CM

## 2021-01-04 DIAGNOSIS — K5730 Diverticulosis of large intestine without perforation or abscess without bleeding: Secondary | ICD-10-CM | POA: Diagnosis present

## 2021-01-04 DIAGNOSIS — Z923 Personal history of irradiation: Secondary | ICD-10-CM

## 2021-01-04 DIAGNOSIS — Z20822 Contact with and (suspected) exposure to covid-19: Secondary | ICD-10-CM | POA: Diagnosis present

## 2021-01-04 DIAGNOSIS — Z79899 Other long term (current) drug therapy: Secondary | ICD-10-CM

## 2021-01-04 DIAGNOSIS — Z538 Procedure and treatment not carried out for other reasons: Secondary | ICD-10-CM | POA: Diagnosis not present

## 2021-01-04 DIAGNOSIS — J8410 Pulmonary fibrosis, unspecified: Secondary | ICD-10-CM | POA: Diagnosis present

## 2021-01-04 DIAGNOSIS — U071 COVID-19: Secondary | ICD-10-CM | POA: Diagnosis not present

## 2021-01-04 DIAGNOSIS — M25559 Pain in unspecified hip: Secondary | ICD-10-CM | POA: Diagnosis present

## 2021-01-04 DIAGNOSIS — R7881 Bacteremia: Secondary | ICD-10-CM

## 2021-01-04 DIAGNOSIS — A419 Sepsis, unspecified organism: Secondary | ICD-10-CM | POA: Diagnosis present

## 2021-01-04 DIAGNOSIS — R1310 Dysphagia, unspecified: Secondary | ICD-10-CM | POA: Diagnosis present

## 2021-01-04 DIAGNOSIS — Z28310 Unvaccinated for covid-19: Secondary | ICD-10-CM

## 2021-01-04 DIAGNOSIS — Z77098 Contact with and (suspected) exposure to other hazardous, chiefly nonmedicinal, chemicals: Secondary | ICD-10-CM | POA: Diagnosis present

## 2021-01-04 DIAGNOSIS — D75839 Thrombocytosis, unspecified: Secondary | ICD-10-CM | POA: Diagnosis present

## 2021-01-04 DIAGNOSIS — I10 Essential (primary) hypertension: Secondary | ICD-10-CM | POA: Diagnosis present

## 2021-01-04 DIAGNOSIS — M009 Pyogenic arthritis, unspecified: Secondary | ICD-10-CM

## 2021-01-04 DIAGNOSIS — M109 Gout, unspecified: Secondary | ICD-10-CM | POA: Diagnosis present

## 2021-01-04 HISTORY — DX: Malignant neoplasm of tonsil, unspecified: C09.9

## 2021-01-04 HISTORY — DX: Pulmonary fibrosis, unspecified: J84.10

## 2021-01-04 HISTORY — DX: Dysphagia, unspecified: R13.10

## 2021-01-04 HISTORY — DX: Functional intestinal disorder, unspecified: K59.9

## 2021-01-04 LAB — COMPREHENSIVE METABOLIC PANEL
ALT: 25 U/L (ref 0–44)
AST: 27 U/L (ref 15–41)
Albumin: 3.2 g/dL — ABNORMAL LOW (ref 3.5–5.0)
Alkaline Phosphatase: 88 U/L (ref 38–126)
Anion gap: 12 (ref 5–15)
BUN: 22 mg/dL (ref 8–23)
CO2: 20 mmol/L — ABNORMAL LOW (ref 22–32)
Calcium: 9 mg/dL (ref 8.9–10.3)
Chloride: 98 mmol/L (ref 98–111)
Creatinine, Ser: 1.06 mg/dL (ref 0.61–1.24)
GFR, Estimated: >60 mL/min (ref >60)
Glucose, Bld: 298 mg/dL — ABNORMAL HIGH (ref 70–99)
Potassium: 4 mmol/L (ref 3.5–5.1)
Sodium: 130 mmol/L — ABNORMAL LOW (ref 135–145)
Total Bilirubin: 0.4 mg/dL (ref 0.3–1.2)
Total Protein: 8.3 g/dL — ABNORMAL HIGH (ref 6.5–8.1)

## 2021-01-04 LAB — LACTIC ACID, PLASMA
Lactic Acid, Venous: 2.5 mmol/L (ref 0.5–1.9)
Lactic Acid, Venous: 1.9 mmol/L (ref 0.5–1.9)

## 2021-01-04 LAB — CBC WITH DIFFERENTIAL/PLATELET
Abs Immature Granulocytes: 0.1 10*3/uL — ABNORMAL HIGH (ref 0.00–0.07)
Basophils Absolute: 0 10*3/uL (ref 0.0–0.1)
Basophils Relative: 0 %
Eosinophils Absolute: 0 10*3/uL (ref 0.0–0.5)
Eosinophils Relative: 0 %
HCT: 36.7 % — ABNORMAL LOW (ref 39.0–52.0)
Hemoglobin: 12.1 g/dL — ABNORMAL LOW (ref 13.0–17.0)
Immature Granulocytes: 1 %
Lymphocytes Relative: 3 %
Lymphs Abs: 0.5 10*3/uL — ABNORMAL LOW (ref 0.7–4.0)
MCH: 30.7 pg (ref 26.0–34.0)
MCHC: 33 g/dL (ref 30.0–36.0)
MCV: 93.1 fL (ref 80.0–100.0)
Monocytes Absolute: 1 10*3/uL (ref 0.1–1.0)
Monocytes Relative: 6 %
Neutro Abs: 14.7 10*3/uL — ABNORMAL HIGH (ref 1.7–7.7)
Neutrophils Relative %: 90 %
Platelets: 444 10*3/uL — ABNORMAL HIGH (ref 150–400)
RBC: 3.94 MIL/uL — ABNORMAL LOW (ref 4.22–5.81)
RDW: 14.3 % (ref 11.5–15.5)
WBC: 16.3 10*3/uL — ABNORMAL HIGH (ref 4.0–10.5)
nRBC: 0 % (ref 0.0–0.2)

## 2021-01-04 LAB — RESP PANEL BY RT-PCR (FLU A&B, COVID) ARPGX2
Influenza A by PCR: NEGATIVE
Influenza B by PCR: NEGATIVE
SARS Coronavirus 2 by RT PCR: NEGATIVE

## 2021-01-04 LAB — PROTIME-INR
INR: 1.1 (ref 0.8–1.2)
Prothrombin Time: 14.6 seconds (ref 11.4–15.2)

## 2021-01-04 LAB — APTT: aPTT: 36 seconds (ref 24–36)

## 2021-01-04 MED ORDER — METRONIDAZOLE 500 MG/100ML IV SOLN
500.0000 mg | Freq: Once | INTRAVENOUS | Status: AC
  Administered 2021-01-04: 500 mg via INTRAVENOUS
  Filled 2021-01-04: qty 100

## 2021-01-04 MED ORDER — SODIUM CHLORIDE 0.9 % IV SOLN
2.0000 g | Freq: Once | INTRAVENOUS | Status: AC
  Administered 2021-01-04: 2 g via INTRAVENOUS
  Filled 2021-01-04: qty 2

## 2021-01-04 MED ORDER — SODIUM CHLORIDE 0.9 % IV SOLN
INTRAVENOUS | Status: DC | PRN
  Administered 2021-01-04: 250 mL via INTRAVENOUS

## 2021-01-04 MED ORDER — SODIUM CHLORIDE 0.9 % IV SOLN
2.0000 g | Freq: Three times a day (TID) | INTRAVENOUS | Status: DC
  Administered 2021-01-05 – 2021-01-06 (×4): 2 g via INTRAVENOUS
  Filled 2021-01-04 (×6): qty 2

## 2021-01-04 MED ORDER — LACTATED RINGERS IV BOLUS
1000.0000 mL | Freq: Once | INTRAVENOUS | Status: AC
  Administered 2021-01-04: 1000 mL via INTRAVENOUS

## 2021-01-04 MED ORDER — IOHEXOL 300 MG/ML  SOLN
100.0000 mL | Freq: Once | INTRAMUSCULAR | Status: AC
  Administered 2021-01-04: 100 mL via INTRAVENOUS

## 2021-01-04 MED ORDER — HYDROMORPHONE HCL 1 MG/ML IJ SOLN
1.0000 mg | Freq: Once | INTRAMUSCULAR | Status: AC
  Administered 2021-01-04: 1 mg via INTRAVENOUS
  Filled 2021-01-04: qty 1

## 2021-01-04 NOTE — ED Triage Notes (Signed)
Pt arrives pov with c/o left lower back pain x 1 week. Pt denies dysuria, or fever. Pt has feeding tube.

## 2021-01-04 NOTE — Progress Notes (Signed)
Pharmacy Antibiotic Note  Marc Rubio is a 75 y.o. male admitted on 01/04/2021 with possible intra-abdominal infection. Pt c/o abdominal distention and severe pain x1 week. WBC 16.3, afebrile, lactic acid 2.5. Urinalysis is unremarkable. SCr 1.06, CrCl 66.15mL/min. Pharmacy has been consulted for cefepime dosing.  Plan: Initiate cefepime 2g IV every 8 hours  Metronidazole per MD Follow up with cultures, imaging, and antibiotic LOT Monitor renal function and clinical progress  Height: 6' (182.9 cm) Weight: 79.4 kg (175 lb) IBW/kg (Calculated) : 77.6  Temp (24hrs), Avg:98.8 F (37.1 C), Min:98.8 F (37.1 C), Max:98.8 F (37.1 C)  Recent Labs  Lab 01/04/21 1741 01/04/21 1935  WBC 16.3*  --   CREATININE 1.06  --   LATICACIDVEN 2.5* 1.9    Estimated Creatinine Clearance: 66.1 mL/min (by C-G formula based on SCr of 1.06 mg/dL).    No Known Allergies  Antimicrobials this admission: Cefepime 4/30 >>  Metronidazole 4/30 x1   Microbiology results: 4/30 BCx: pending 4/30 UCx: pending   Thank you for allowing pharmacy to be a part of this patient's care.  Mercy Riding, PharmD PGY1 Acute Care Pharmacy Resident Please refer to [facility] for unit-specific pharmacist

## 2021-01-04 NOTE — ED Provider Notes (Addendum)
[facility] EMERGENCY DEPARTMENT Provider Note   CSN: [id number] Arrival date & time: 01/04/21  1701     History Chief Complaint  Patient presents with  . Back Pain    Marc Rubio is a 75 y.o. male.  Worsening abdominal pain for 1 month.  Abdominal distention.  Still tolerating tube feeds.  Still passing bowel movements.  No significant bloody vomiting, no changes to stool.  Has had history of cancer requiring feeding tube placement.  No sick contacts no trauma no recent surgery        Past Medical History:  Diagnosis Date  . 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 Coronary artery disease   . Dysphagia   . Gout   . Pulmonary fibrosis ([place])    Incedental finding    Patient Active Problem List   Diagnosis Date Noted  . Intra-abdominal infection 01/05/2021  . Ileus ([place]) 01/05/2021  . DM2 (diabetes mellitus, type 2) ([place]) 01/05/2021  . Sepsis due to undetermined organism ([place]) 01/04/2021    Past Surgical History:  Procedure Laterality Date  . ABDOMINAL AORTIC ENDOVASCULAR STENT GRAFT    . PEG TUBE PLACEMENT         Family History  Problem Relation Age of Onset  . Cancer Sister   . Cancer Brother     Social History   Tobacco Use  . Smoking status: Never Smoker  . Smokeless tobacco: Never Used  Vaping Use  . Vaping Use: Never used  Substance Use Topics  . Alcohol use: Not Currently  . Drug use: Not Currently    Home Medications Prior to Admission medications   Medication Sig Start Date End Date Taking? Authorizing Provider  allopurinol (ZYLOPRIM) 300 MG tablet Take 1 tablet (300 mg total) by mouth daily. 08/13/20  Yes Deno Etienne, DO  aspirin 81 MG chewable tablet Place 81 mg into feeding tube daily. 07/11/17  Yes [provider]  esomeprazole (NEXIUM) 40 MG packet Take 40 mg by mouth daily. 12/02/20  Yes [provider]  feeding supplement (OSMOLITE 1 CAL) LIQD Take 237 mLs by  mouth 6 (six) times daily.   Yes [provider]  ferrous sulfate 220 (44 Fe) MG/5ML solution Place 220 mg into feeding tube daily. 11/29/20  Yes [provider]  polyethylene glycol powder (GLYCOLAX/MIRALAX) 17 GM/SCOOP powder Place 17 g into feeding tube daily. 07/15/17  Yes [provider]  colchicine 0.6 MG tablet Take 1 tablet (0.6 mg total) by mouth daily. Patient not taking: Reported on 01/05/2021 11/25/20 12/25/20  Garald Balding, PA-C  doxycycline (VIBRAMYCIN) 100 MG capsule Take 1 capsule (100 mg total) by mouth 2 (two) times daily. Patient not taking: No sig reported 11/25/20   Garald Balding, PA-C  methylPREDNISolone (MEDROL DOSEPAK) 4 MG TBPK tablet Take as directed until finished Patient not taking: No sig reported 11/25/20   Garald Balding, PA-C    Allergies    Patient has no known allergies.  Review of Systems   Review of Systems  Constitutional: Negative for chills and fever.  HENT: Negative for congestion and rhinorrhea.   Respiratory: Negative for cough and shortness of breath.   Cardiovascular: Negative for chest pain and palpitations.  Gastrointestinal: Positive for abdominal distention and abdominal pain. Negative for diarrhea, nausea and vomiting.  Genitourinary: Negative for difficulty urinating and dysuria.  Musculoskeletal: Negative for arthralgias and back pain.  Skin: Negative for color  change and rash.  Neurological: Negative for light-headedness and headaches.    Physical Exam Updated Vital Signs BP 117/76   Pulse 99   Temp 98.8 F (37.1 C) (Oral)   Resp (!) 28   Ht 6' (1.829 m)   Wt 79.4 kg   SpO2 96%   BMI 23.73 kg/m   Physical Exam Vitals and nursing note reviewed.  Constitutional:      General: He is not in acute distress.    Appearance: Normal appearance.  HENT:     Head: Normocephalic and atraumatic.     Nose: No rhinorrhea.  Eyes:     General:        Right eye: No discharge.        Left eye: No discharge.      Conjunctiva/sclera: Conjunctivae normal.  Cardiovascular:     Rate and Rhythm: Regular rhythm. Tachycardia present.  Pulmonary:     Effort: Pulmonary effort is normal.     Breath sounds: No stridor.  Abdominal:     General: Abdomen is flat. There is distension.     Palpations: Abdomen is soft.     Tenderness: There is abdominal tenderness. There is no guarding or rebound.  Musculoskeletal:        General: No deformity or signs of injury.  Skin:    General: Skin is warm and dry.  Neurological:     General: No focal deficit present.     Mental Status: He is alert. Mental status is at baseline.     Motor: No weakness.  Psychiatric:        Mood and Affect: Mood normal.        Behavior: Behavior normal.        Thought Content: Thought content normal.     ED Results / Procedures / Treatments   Labs (all labs ordered are listed, but only abnormal results are displayed) Labs Reviewed  BLOOD CULTURE ID PANEL (REFLEXED) - BCID2 - Abnormal; Notable for the following components:      Result Value   Streptococcus species DETECTED (*)    All other components within normal limits  URINALYSIS, ROUTINE W REFLEX MICROSCOPIC - Abnormal; Notable for the following components:   Specific Gravity, Urine <1.005 (*)    Protein, ur 30 (*)    All other components within normal limits  LACTIC ACID, PLASMA - Abnormal; Notable for the following components:   Lactic Acid, Venous 2.5 (*)    All other components within normal limits  COMPREHENSIVE METABOLIC PANEL - Abnormal; Notable for the following components:   Sodium 130 (*)    CO2 20 (*)    Glucose, Bld 298 (*)    Total Protein 8.3 (*)    Albumin 3.2 (*)    All other components within normal limits  CBC WITH DIFFERENTIAL/PLATELET - Abnormal; Notable for the following components:   WBC 16.3 (*)    RBC 3.94 (*)    Hemoglobin 12.1 (*)    HCT 36.7 (*)    Platelets 444 (*)    Neutro Abs 14.7 (*)    Lymphs Abs 0.5 (*)    Abs Immature  Granulocytes 0.10 (*)    All other components within normal limits  URINALYSIS, MICROSCOPIC (REFLEX) - Abnormal; Notable for the following components:   Bacteria, UA MANY (*)    All other components within normal limits  CBC - Abnormal; Notable for the following components:   WBC 12.3 (*)    RBC 3.52 (*)  Hemoglobin 10.6 (*)    HCT 33.1 (*)    All other components within normal limits  HEMOGLOBIN A1C - Abnormal; Notable for the following components:   Hgb A1c MFr Bld 6.2 (*)    All other components within normal limits  GLUCOSE, CAPILLARY - Abnormal; Notable for the following components:   Glucose-Capillary 152 (*)    All other components within normal limits  GLUCOSE, CAPILLARY - Abnormal; Notable for the following components:   Glucose-Capillary 123 (*)    All other components within normal limits  GLUCOSE, CAPILLARY - Abnormal; Notable for the following components:   Glucose-Capillary 116 (*)    All other components within normal limits  CULTURE, BLOOD (SINGLE)  RESP PANEL BY RT-PCR (FLU A&B, COVID) ARPGX2  MRSA PCR SCREENING  URINE CULTURE  CULTURE, BLOOD (SINGLE)  LACTIC ACID, PLASMA  PROTIME-INR  APTT  MAGNESIUM  GLUCOSE, CAPILLARY  CBC WITH DIFFERENTIAL/PLATELET  BASIC METABOLIC PANEL    EKG EKG Interpretation  Date/Time:  Saturday January 04 2021 19:33:56 EDT Ventricular Rate:  112 PR Interval:  132 QRS Duration: 92 QT Interval:  345 QTC Calculation: 471 R Axis:   37 Text Interpretation: Sinus tachycardia with irregular rate No significant change since last tracing Confirmed by Isla Pence [phone]) on 01/05/2021 12:03:02 PM   Radiology CT ABDOMEN PELVIS W CONTRAST  Addendum Date: 01/04/2021   ADDENDUM REPORT: 01/04/2021 21:40 ADDENDUM: Small area of low attenuation along the wall of the distal sigmoid colon which may represent a small area of volume averaging. Correlation with nonemergent abdomen and pelvis CT with rectal contrast is recommended to  exclude the presence of a small soft tissue mass. Electronically Signed   By: Virgina Norfolk M.D.   On: 01/04/2021 21:40   Result Date: 01/04/2021 CLINICAL DATA:  Left lower quadrant pain. EXAM: CT ABDOMEN AND PELVIS WITH CONTRAST TECHNIQUE: Multidetector CT imaging of the abdomen and pelvis was performed using the standard protocol following bolus administration of intravenous contrast. CONTRAST:  156mL OMNIPAQUE IOHEXOL 300 MG/ML  SOLN COMPARISON:  August 11, 2017 FINDINGS: Lower chest: Chronic areas of scarring and fibrosis are seen within the bilateral lung bases. Hepatobiliary: There is diffuse fatty infiltration of the liver parenchyma. No focal liver abnormality is seen. No gallstones, gallbladder wall thickening, or biliary dilatation. Pancreas: Unremarkable. No pancreatic ductal dilatation or surrounding inflammatory changes. Spleen: Normal in size without focal abnormality. Adrenals/Urinary Tract: Adrenal glands are unremarkable. Kidneys are normal, without renal calculi, focal lesion, or hydronephrosis. Bladder is unremarkable. Stomach/Bowel: A percutaneous gastrostomy tube is seen with its distal tip and insufflator bulb noted within the body of the stomach. The stomach is otherwise within normal limits. The appendix is not clearly identified. The small bowel is decompressed. Mildly prominent air-filled cecum, ascending and transverse colon is seen. An abrupt transition zone is noted at the level of the proximal to mid descending colon (axial CT images 33 through 40, CT series number 2). No obstructing mass lesions are identified. Noninflamed diverticula are seen throughout the sigmoid colon. An 11 mm x 9 mm well-defined area of low attenuation is seen within the wall of the distal sigmoid colon (axial CT image 80, CT series number 2 Vascular/Lymphatic: There is prior stenting of the infrarenal abdominal aorta and bilateral common iliac arteries. A 4.2 cm x 1.4 cm area of anterior para-aortic and  aortocaval low attenuation is noted which represents a new finding when compared to the prior study (axial CT images 39-46, CT series number 2). No  enlarged abdominal or pelvic lymph nodes. Reproductive: There is moderate to marked severity prostate gland enlargement. Other: No abdominal wall hernia or abnormality. No abdominopelvic ascites. Musculoskeletal: A chronic compression fracture deformity is seen at the level of L4. Degenerative changes are noted throughout the remainder of the lumbar spine. IMPRESSION: 1. Prior endovascular repair of the infrarenal abdominal aorta with interval development of an area of para-aortic and aortocaval low-attenuation since the prior study. This may represent sequelae associated with an endoleak or infection. Correlation with follow-up CTA is recommended to determine stability. 2. Mildly prominent air-filled loops of large bowel, as described above, which may be transient in nature. Correlation with follow-up imaging is recommended if clinical symptoms persist, as an early partial large bowel obstruction cannot be excluded. 3. Sigmoid diverticulosis. Electronically Signed: By: Virgina Norfolk M.D. On: 01/04/2021 20:47   DG Chest Port 1 View  Result Date: 01/04/2021 CLINICAL DATA:  Questionable sepsis.  Low back pain. EXAM: PORTABLE CHEST 1 VIEW COMPARISON:  Chest x-ray dated 11/25/2020. FINDINGS: Mild atelectasis at the LEFT lung base. Lungs otherwise clear. No pleural effusion or pneumothorax is seen. Heart size and mediastinal contours are stable. Osseous structures about the chest are unremarkable. IMPRESSION: No active disease.  No evidence of pneumonia. Electronically Signed   By: Franki Cabot M.D.   On: 01/04/2021 20:25    Procedures Procedures   Medications Ordered in ED Medications  0.9 %  sodium chloride infusion ( Intravenous Stopped 01/04/21 2347)  ceFEPIme (MAXIPIME) 2 g in sodium chloride 0.9 % 100 mL IVPB (2 g Intravenous New Bag/Given 01/05/21 2110)   metroNIDAZOLE (FLAGYL) IVPB 500 mg (500 mg Intravenous New Bag/Given 01/05/21 2226)  lactated ringers infusion ( Intravenous New Bag/Given 01/05/21 0224)  enoxaparin (LOVENOX) injection 40 mg (40 mg Subcutaneous Given 01/05/21 1246)  acetaminophen (TYLENOL) tablet 650 mg (has no administration in time range)    Or  acetaminophen (TYLENOL) suppository 650 mg (has no administration in time range)  ondansetron (ZOFRAN) tablet 4 mg (has no administration in time range)    Or  ondansetron (ZOFRAN) injection 4 mg (has no administration in time range)  insulin aspart (novoLOG) injection 0-9 Units (0 Units Subcutaneous Not Given 01/05/21 2114)  feeding supplement (OSMOLITE 1.5 CAL) liquid 1,000 mL (1,000 mLs Per Tube New Bag/Given 01/05/21 1018)  allopurinol (ZYLOPRIM) tablet 300 mg (300 mg Oral Given 01/05/21 0954)  colchicine tablet 0.6 mg (0.6 mg Oral Given 01/05/21 0954)  traMADol (ULTRAM) tablet 50 mg (50 mg Oral Given 01/05/21 2234)  HYDROmorphone (DILAUDID) injection 1 mg (1 mg Intravenous Given 01/04/21 1942)  lactated ringers bolus 1,000 mL ( Intravenous Stopped 01/04/21 2106)  iohexol (OMNIPAQUE) 300 MG/ML solution 100 mL (100 mLs Intravenous Contrast Given 01/04/21 1957)  ceFEPIme (MAXIPIME) 2 g in sodium chloride 0.9 % 100 mL IVPB (0 g Intravenous Stopped 01/04/21 2147)  metroNIDAZOLE (FLAGYL) IVPB 500 mg (0 mg Intravenous Stopped 01/04/21 2347)    ED Course  I have reviewed the triage vital signs and the nursing notes.  Pertinent labs & imaging results that were available during my care of the patient were reviewed by me and considered in my medical decision making (see chart for details).  Clinical Course as of 01/06/21 6144  Sat Jan 04, 2021  2049 I took call for Dr. Ron Parker from radiology about CT scan result Prominent large bowel loops with transition in proximal descending colon, can be transient vs early large bowel obstruction.  Since the prior study the patient had  vascular repar small area  anterior to the aorta around the aorta caval region which is new.  Its either a new endo leak or infection. Follow up with in twenty four hours. If its a leak its not acute right now but if its an infection that is concerning but tomorrow get a repeat CTA  [EH]    Clinical Course User Index [EH] Lorin Glass, PA-C   MDM Rules/Calculators/A&P                          Abdominal distention severe pain over a week.  Elevated lactic acidosis triage.  Tachypneic tachycardic.  Tender to palpation throughout distention and tympany.  Will get advanced imaging we will get screening labs will get IV fluids pain control cultures will be drawn.  No fevers.  Reportedly normal bowel function.  Does have indwelling feeding tube for history of cancer.  Patient CT imaging concerning for infectious versus aortic leak.  Antibiotics were given as the patient was tachypneic tachycardic had lactic acidosis.  Vascular surgery was consulted and recommends admission to medicine service for further observation and possible CTA.  Medicine agrees.  Broad-spectrum antibiotics given.  Cultures obtained.  Patient agrees for transportation. Final Clinical Impression(s) / ED Diagnoses Final diagnoses:  Intra-abdominal infection    Rx / DC Orders ED Discharge Orders    None       Breck Coons, MD 01/06/21 9622    Breck Coons, MD 01/06/21 [phone]

## 2021-01-04 NOTE — ED Notes (Signed)
Cannot pee at this time

## 2021-01-05 ENCOUNTER — Encounter (HOSPITAL_COMMUNITY): Payer: Self-pay | Admitting: Internal Medicine

## 2021-01-05 DIAGNOSIS — Z79899 Other long term (current) drug therapy: Secondary | ICD-10-CM | POA: Diagnosis not present

## 2021-01-05 DIAGNOSIS — D75839 Thrombocytosis, unspecified: Secondary | ICD-10-CM | POA: Diagnosis present

## 2021-01-05 DIAGNOSIS — M109 Gout, unspecified: Secondary | ICD-10-CM | POA: Diagnosis present

## 2021-01-05 DIAGNOSIS — D509 Iron deficiency anemia, unspecified: Secondary | ICD-10-CM | POA: Diagnosis present

## 2021-01-05 DIAGNOSIS — Z20822 Contact with and (suspected) exposure to covid-19: Secondary | ICD-10-CM | POA: Diagnosis present

## 2021-01-05 DIAGNOSIS — K219 Gastro-esophageal reflux disease without esophagitis: Secondary | ICD-10-CM | POA: Diagnosis present

## 2021-01-05 DIAGNOSIS — I2510 Atherosclerotic heart disease of native coronary artery without angina pectoris: Secondary | ICD-10-CM | POA: Diagnosis present

## 2021-01-05 DIAGNOSIS — E119 Type 2 diabetes mellitus without complications: Secondary | ICD-10-CM | POA: Diagnosis present

## 2021-01-05 DIAGNOSIS — Z923 Personal history of irradiation: Secondary | ICD-10-CM | POA: Diagnosis not present

## 2021-01-05 DIAGNOSIS — R7881 Bacteremia: Secondary | ICD-10-CM | POA: Diagnosis not present

## 2021-01-05 DIAGNOSIS — M009 Pyogenic arthritis, unspecified: Secondary | ICD-10-CM | POA: Diagnosis present

## 2021-01-05 DIAGNOSIS — U071 COVID-19: Secondary | ICD-10-CM | POA: Diagnosis not present

## 2021-01-05 DIAGNOSIS — I10 Essential (primary) hypertension: Secondary | ICD-10-CM | POA: Diagnosis present

## 2021-01-05 DIAGNOSIS — M25559 Pain in unspecified hip: Secondary | ICD-10-CM | POA: Diagnosis present

## 2021-01-05 DIAGNOSIS — Z85818 Personal history of malignant neoplasm of other sites of lip, oral cavity, and pharynx: Secondary | ICD-10-CM | POA: Diagnosis not present

## 2021-01-05 DIAGNOSIS — M545 Low back pain, unspecified: Secondary | ICD-10-CM | POA: Diagnosis present

## 2021-01-05 DIAGNOSIS — J8410 Pulmonary fibrosis, unspecified: Secondary | ICD-10-CM | POA: Diagnosis present

## 2021-01-05 DIAGNOSIS — R1310 Dysphagia, unspecified: Secondary | ICD-10-CM | POA: Diagnosis present

## 2021-01-05 DIAGNOSIS — A419 Sepsis, unspecified organism: Secondary | ICD-10-CM | POA: Diagnosis not present

## 2021-01-05 DIAGNOSIS — Z86010 Personal history of colonic polyps: Secondary | ICD-10-CM | POA: Diagnosis not present

## 2021-01-05 DIAGNOSIS — K567 Ileus, unspecified: Secondary | ICD-10-CM | POA: Diagnosis present

## 2021-01-05 DIAGNOSIS — Z7982 Long term (current) use of aspirin: Secondary | ICD-10-CM | POA: Diagnosis not present

## 2021-01-05 DIAGNOSIS — R933 Abnormal findings on diagnostic imaging of other parts of digestive tract: Secondary | ICD-10-CM | POA: Diagnosis not present

## 2021-01-05 DIAGNOSIS — B999 Unspecified infectious disease: Secondary | ICD-10-CM | POA: Diagnosis present

## 2021-01-05 DIAGNOSIS — A408 Other streptococcal sepsis: Secondary | ICD-10-CM | POA: Diagnosis present

## 2021-01-05 DIAGNOSIS — T82898A Other specified complication of vascular prosthetic devices, implants and grafts, initial encounter: Secondary | ICD-10-CM

## 2021-01-05 DIAGNOSIS — Z931 Gastrostomy status: Secondary | ICD-10-CM | POA: Diagnosis not present

## 2021-01-05 DIAGNOSIS — Z77098 Contact with and (suspected) exposure to other hazardous, chiefly nonmedicinal, chemicals: Secondary | ICD-10-CM | POA: Diagnosis present

## 2021-01-05 DIAGNOSIS — R682 Dry mouth, unspecified: Secondary | ICD-10-CM | POA: Diagnosis present

## 2021-01-05 DIAGNOSIS — K5730 Diverticulosis of large intestine without perforation or abscess without bleeding: Secondary | ICD-10-CM | POA: Diagnosis present

## 2021-01-05 LAB — BLOOD CULTURE ID PANEL (REFLEXED) - BCID2

## 2021-01-05 LAB — URINALYSIS, MICROSCOPIC (REFLEX)

## 2021-01-05 LAB — MAGNESIUM: Magnesium: 2 mg/dL (ref 1.7–2.4)

## 2021-01-05 LAB — GLUCOSE, CAPILLARY
Glucose-Capillary: 152 mg/dL — ABNORMAL HIGH (ref 70–99)
Glucose-Capillary: 93 mg/dL (ref 70–99)
Glucose-Capillary: 123 mg/dL — ABNORMAL HIGH (ref 70–99)
Glucose-Capillary: 116 mg/dL — ABNORMAL HIGH (ref 70–99)

## 2021-01-05 LAB — URINALYSIS, ROUTINE W REFLEX MICROSCOPIC
Bilirubin Urine: NEGATIVE
Glucose, UA: NEGATIVE mg/dL
Hgb urine dipstick: NEGATIVE
Ketones, ur: NEGATIVE mg/dL
Leukocytes,Ua: NEGATIVE
Nitrite: NEGATIVE
Protein, ur: 30 mg/dL — AB
Specific Gravity, Urine: <1.005 — ABNORMAL LOW (ref 1.005–1.030)
pH: 6.5 (ref 5.0–8.0)

## 2021-01-05 LAB — HEMOGLOBIN A1C
Hgb A1c MFr Bld: 6.2 % — ABNORMAL HIGH (ref 4.8–5.6)
Mean Plasma Glucose: 131.24 mg/dL

## 2021-01-05 LAB — CBC
HCT: 33.1 % — ABNORMAL LOW (ref 39.0–52.0)
Hemoglobin: 10.6 g/dL — ABNORMAL LOW (ref 13.0–17.0)
MCH: 30.1 pg (ref 26.0–34.0)
MCHC: 32 g/dL (ref 30.0–36.0)
MCV: 94 fL (ref 80.0–100.0)
Platelets: 369 10*3/uL (ref 150–400)
RBC: 3.52 MIL/uL — ABNORMAL LOW (ref 4.22–5.81)
RDW: 14.5 % (ref 11.5–15.5)
WBC: 12.3 10*3/uL — ABNORMAL HIGH (ref 4.0–10.5)
nRBC: 0 % (ref 0.0–0.2)

## 2021-01-05 LAB — MRSA PCR SCREENING: MRSA by PCR: NEGATIVE

## 2021-01-05 MED ORDER — HYDROMORPHONE HCL 1 MG/ML IJ SOLN
0.5000 mg | INTRAMUSCULAR | Status: DC | PRN
Start: 2021-01-05 — End: 2021-01-05
  Administered 2021-01-05: 1 mg via INTRAVENOUS
  Filled 2021-01-05: qty 1

## 2021-01-05 MED ORDER — COLCHICINE 0.6 MG PO TABS
0.6000 mg | ORAL_TABLET | Freq: Every day | ORAL | Status: DC
  Administered 2021-01-05 – 2021-01-22 (×18): 0.6 mg via ORAL
  Filled 2021-01-05 (×18): qty 1

## 2021-01-05 MED ORDER — ACETAMINOPHEN 325 MG PO TABS
650.0000 mg | ORAL_TABLET | Freq: Four times a day (QID) | ORAL | Status: DC | PRN
  Administered 2021-01-06 – 2021-01-21 (×7): 650 mg via ORAL
  Filled 2021-01-05 (×7): qty 2

## 2021-01-05 MED ORDER — ENOXAPARIN SODIUM 40 MG/0.4ML IJ SOSY
40.0000 mg | PREFILLED_SYRINGE | INTRAMUSCULAR | Status: DC
  Administered 2021-01-05 – 2021-01-19 (×12): 40 mg via SUBCUTANEOUS
  Filled 2021-01-05 (×18): qty 0.4

## 2021-01-05 MED ORDER — METRONIDAZOLE 500 MG/100ML IV SOLN
500.0000 mg | Freq: Three times a day (TID) | INTRAVENOUS | Status: DC
  Administered 2021-01-05 – 2021-01-06 (×4): 500 mg via INTRAVENOUS
  Filled 2021-01-05 (×4): qty 100

## 2021-01-05 MED ORDER — TRAMADOL HCL 50 MG PO TABS
50.0000 mg | ORAL_TABLET | Freq: Four times a day (QID) | ORAL | Status: DC | PRN
  Administered 2021-01-05 – 2021-01-22 (×31): 50 mg via ORAL
  Filled 2021-01-05 (×31): qty 1

## 2021-01-05 MED ORDER — INSULIN ASPART 100 UNIT/ML IJ SOLN
0.0000 [IU] | INTRAMUSCULAR | Status: DC
  Administered 2021-01-05: 1 [IU] via SUBCUTANEOUS
  Administered 2021-01-05: 2 [IU] via SUBCUTANEOUS
  Administered 2021-01-06 – 2021-01-10 (×2): 1 [IU] via SUBCUTANEOUS

## 2021-01-05 MED ORDER — ONDANSETRON HCL 4 MG/2ML IJ SOLN: 4.0000 mg | Freq: Four times a day (QID) | INTRAMUSCULAR | Status: DC | PRN

## 2021-01-05 MED ORDER — ACETAMINOPHEN 650 MG RE SUPP: 650.0000 mg | Freq: Four times a day (QID) | RECTAL | Status: DC | PRN

## 2021-01-05 MED ORDER — OSMOLITE 1.5 CAL PO LIQD
1000.0000 mL | ORAL | Status: DC
  Administered 2021-01-05: 1000 mL
  Filled 2021-01-05 (×3): qty 1000

## 2021-01-05 MED ORDER — ALLOPURINOL 300 MG PO TABS
300.0000 mg | ORAL_TABLET | Freq: Every day | ORAL | Status: DC
  Administered 2021-01-05 – 2021-01-22 (×18): 300 mg via ORAL
  Filled 2021-01-05 (×18): qty 1

## 2021-01-05 MED ORDER — ONDANSETRON HCL 4 MG PO TABS: 4.0000 mg | ORAL_TABLET | Freq: Four times a day (QID) | ORAL | Status: DC | PRN

## 2021-01-05 MED ORDER — LACTATED RINGERS IV SOLN: INTRAVENOUS | Status: DC
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
## 2021-01-05 NOTE — Progress Notes (Signed)
Elink Code Sepsis completion note:  Admitted to 2W for further monitoring.

## 2021-01-05 NOTE — Progress Notes (Signed)
PHARMACY - PHYSICIAN COMMUNICATION CRITICAL VALUE ALERT - BLOOD CULTURE IDENTIFICATION (BCID)  Marc Rubio is an 75 y.o. male who presented to [facility] on 01/04/2021 with a chief complaint of severe, left sided back pain  Assessment:  BCID - 1 out of 3 - anaerobic bottle - GPC - Strep species - likely a contaminant  Name of physician (or Provider) Contacted: Samtani  Current antibiotics: Cefepime and flagyl  Changes to prescribed antibiotics recommended:  No change recommended  Results for orders placed or performed during the hospital encounter of 01/04/21  Blood Culture ID Panel (Reflexed) (Collected: 01/04/2021  7:35 PM)  Result Value Ref Range   Enterococcus faecalis NOT DETECTED NOT DETECTED   Enterococcus Faecium NOT DETECTED NOT DETECTED   Listeria monocytogenes NOT DETECTED NOT DETECTED   Staphylococcus species NOT DETECTED NOT DETECTED   Staphylococcus aureus (BCID) NOT DETECTED NOT DETECTED   Staphylococcus epidermidis NOT DETECTED NOT DETECTED   Staphylococcus lugdunensis NOT DETECTED NOT DETECTED   Streptococcus species DETECTED (A) NOT DETECTED   Streptococcus agalactiae NOT DETECTED NOT DETECTED   Streptococcus pneumoniae NOT DETECTED NOT DETECTED   Streptococcus pyogenes NOT DETECTED NOT DETECTED   A.calcoaceticus-baumannii NOT DETECTED NOT DETECTED   Bacteroides fragilis NOT DETECTED NOT DETECTED   Enterobacterales NOT DETECTED NOT DETECTED   Enterobacter cloacae complex NOT DETECTED NOT DETECTED   Escherichia coli NOT DETECTED NOT DETECTED   Klebsiella aerogenes NOT DETECTED NOT DETECTED   Klebsiella oxytoca NOT DETECTED NOT DETECTED   Klebsiella pneumoniae NOT DETECTED NOT DETECTED   Proteus species NOT DETECTED NOT DETECTED   Salmonella species NOT DETECTED NOT DETECTED   Serratia marcescens NOT DETECTED NOT DETECTED   Haemophilus influenzae NOT DETECTED NOT DETECTED   Neisseria meningitidis NOT DETECTED NOT DETECTED   Pseudomonas aeruginosa NOT  DETECTED NOT DETECTED   Stenotrophomonas maltophilia NOT DETECTED NOT DETECTED   Candida albicans NOT DETECTED NOT DETECTED   Candida auris NOT DETECTED NOT DETECTED   Candida glabrata NOT DETECTED NOT DETECTED   Candida krusei NOT DETECTED NOT DETECTED   Candida parapsilosis NOT DETECTED NOT DETECTED   Candida tropicalis NOT DETECTED NOT DETECTED   Cryptococcus neoformans/gattii NOT DETECTED NOT [place] 01/05/2021  3:26 PM

## 2021-01-05 NOTE — Progress Notes (Signed)
Patient arrived to floor via ems on stretcher, alert and oriented, able to verbalize needs. Assisted to bed from stretcher with staff assist. Pt hook up to monitor. IV lactated ringers started via peripheral iv in (L) ac at 172ml/hr. Vs stable.

## 2021-01-05 NOTE — Consult Note (Signed)
Vascular and Vein Specialist of [facility]  Patient name: Marc Rubio MRN: [id number] DOB: 1946/08/24 Sex: male   REQUESTING PROVIDER:    ER   REASON FOR CONSULT:    ? Aortic graft infection  HISTORY OF PRESENT ILLNESS:   Marc Rubio is a 76 y.o. male, who presented to [place] yesterday with a 1 week history of lower left-sided back pain.  His work-up included a CT scan which showed a low-attenuation area around his aortic graft.  The differential included endoleak versus infection.  His stent graft was placed approximately 3 years ago at [facility] for aneurysmal disease.  The patient denies any fevers or chills.  He was started on antibiotics and transferred to [facility] for further evaluation  The patient has a history of tonsillar cancer.  He has a PEG tube in place.  He has a history of coronary artery disease he is a non-smoker  PAST MEDICAL HISTORY    Past Medical History:  Diagnosis Date  . 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 Coronary artery disease   . Dysphagia   . Gout   . Pulmonary fibrosis ([facility])    Incedental finding     FAMILY HISTORY   Family History  Problem Relation Age of Onset  . Cancer Sister   . Cancer Brother     SOCIAL HISTORY:   Social History   Socioeconomic History  . Marital status: Single    Spouse name: Not on file  . Number of children: Not on file  . Years of education: Not on file  . Highest education level: Not on file  Occupational History  . Not on file  Tobacco Use  . Smoking status: Never Smoker  . Smokeless tobacco: Never Used  Vaping Use  . Vaping Use: Never used  Substance and Sexual Activity  . Alcohol use: Not Currently  . Drug use: Not Currently  . Sexual activity: Not on file  Other Topics Concern  . Not on file  Social History Narrative  . Not on file   Social Determinants of Health    Financial Resource Strain: Not on file  Food Insecurity: Not on file  Transportation Needs: Not on file  Physical Activity: Not on file  Stress: Not on file  Social Connections: Not on file  Intimate Partner Violence: Not on file    ALLERGIES:    No Known Allergies  CURRENT MEDICATIONS:    Current Facility-Administered Medications  Medication Dose Route Frequency Provider Last Rate Last Admin  . 0.9 %  sodium chloride infusion   Intravenous PRN Breck Coons, MD   Stopped at 01/04/21 2347  . acetaminophen (TYLENOL) tablet 650 mg  650 mg Oral Q6H PRN Etta Quill, DO       Or  . acetaminophen (TYLENOL) suppository 650 mg  650 mg Rectal Q6H PRN Etta Quill, DO      . allopurinol (ZYLOPRIM) tablet 300 mg  300 mg Oral Daily Samtani, Jai-Gurmukh, MD      . ceFEPIme (MAXIPIME) 2 g in sodium chloride 0.9 % 100 mL IVPB  2 g Intravenous Q8H Amedeo Plenty, RPH 200 mL/hr at 01/05/21 0628 2 g at 01/05/21 4174  . colchicine tablet 0.6 mg  0.6 mg Oral Daily Samtani, Jai-Gurmukh, MD      . enoxaparin (LOVENOX) injection 40 mg  40 mg Subcutaneous Q24H Etta Quill,  DO      . feeding supplement (OSMOLITE 1.5 CAL) liquid 1,000 mL  1,000 mL Per Tube Continuous Samtani, Jai-Gurmukh, MD      . insulin aspart (novoLOG) injection 0-9 Units  0-9 Units Subcutaneous Q4H Etta Quill, DO   2 Units at 01/05/21 0344  . lactated ringers infusion   Intravenous Continuous Etta Quill, DO 100 mL/hr at 01/05/21 0224 New Bag at 01/05/21 0224  . metroNIDAZOLE (FLAGYL) IVPB 500 mg  500 mg Intravenous Q8H Jennette Kettle M, DO 100 mL/hr at 01/05/21 0522 500 mg at 01/05/21 0522  . ondansetron (ZOFRAN) tablet 4 mg  4 mg Oral Q6H PRN Etta Quill, DO       Or  . ondansetron [facility]) injection 4 mg  4 mg Intravenous Q6H PRN Etta Quill, DO      . traMADol Veatrice Bourbon) tablet 50 mg  50 mg Oral Q6H PRN Nita Sells, MD        REVIEW OF SYSTEMS:   [X]  denotes positive finding, [ ]   denotes negative finding Cardiac  Comments:  Chest pain or chest pressure:    Shortness of breath upon exertion:    Short of breath when lying flat:    Irregular heart rhythm:        Vascular    Pain in calf, thigh, or hip brought on by ambulation:    Pain in feet at night that wakes you up from your sleep:     Blood clot in your veins:    Leg swelling:         Pulmonary    Oxygen at home:    Productive cough:     Wheezing:         Neurologic    Sudden weakness in arms or legs:     Sudden numbness in arms or legs:     Sudden onset of difficulty speaking or slurred speech:    Temporary loss of vision in one eye:     Problems with dizziness:         Gastrointestinal    Blood in stool:      Vomited blood:         Genitourinary    Burning when urinating:     Blood in urine:        Psychiatric    Major depression:         Hematologic    Bleeding problems:    Problems with blood clotting too easily:        Skin    Rashes or ulcers:        Constitutional    Fever or chills:     PHYSICAL EXAM:   Vitals:   01/05/21 0123 01/05/21 0205 01/05/21 0300 01/05/21 0746  BP:  117/66  121/68  Pulse:    90  Resp:  18  20  Temp: 98.8 F (37.1 C) 98.5 F (36.9 C) 98.2 F (36.8 C) 98.4 F (36.9 C)  TempSrc: Oral Oral Oral Oral  SpO2:  96%  97%  Weight:      Height:  6' (1.829 m)      GENERAL: The patient is a well-nourished male, in no acute distress. The vital signs are documented above. CARDIAC: There is a regular rate and rhythm.  VASCULAR: Nonpalpable pedal pulses PULMONARY: Nonlabored respirations ABDOMEN: Soft and non-tender PEG tube in place MUSCULOSKELETAL: There are no major deformities or cyanosis. NEUROLOGIC: No focal weakness or paresthesias are detected. SKIN: There are  no ulcers or rashes noted. PSYCHIATRIC: The patient has a normal affect.  STUDIES:   I have reviewed the CT scan with the following findings:  1. Prior endovascular repair of the  infrarenal abdominal aorta with interval development of an area of para-aortic and aortocaval low-attenuation since the prior study. This may represent sequelae associated with an endoleak or infection. Correlation with follow-up CTA is recommended to determine stability. 2. Mildly prominent air-filled loops of large bowel, as described above, which may be transient in nature. Correlation with follow-up imaging is recommended if clinical symptoms persist, as an early partial large bowel obstruction cannot be excluded. 3. Sigmoid diverticulosis.  ASSESSMENT and PLAN   Area of periaortic low-attenuation was seen on CT scan and is concerning for infection.  Clinically however the patient is without upper abdominal pain and denies fevers or chills.  At this time it is unclear whether or not this represents an infection.  However infection cannot be ruled out therefore I think he should can continue with IV antibiotics.  Infectious disease consultation is recommended.  Typically an infected aortic graft would be removed, however I do not think the patient is a good surgical candidate for this and therefore all attempts should be made to treat this nonoperatively.  I would recommend repeating his CT scan in a few days to see if there has been any interval change.   Leia Alf, MD, FACS [facility] of [facility] [phone] Pager [phone]

## 2021-01-06 ENCOUNTER — Other Ambulatory Visit (HOSPITAL_COMMUNITY): Payer: Medicare Other

## 2021-01-06 ENCOUNTER — Inpatient Hospital Stay (HOSPITAL_COMMUNITY): Payer: Medicare Other

## 2021-01-06 DIAGNOSIS — A419 Sepsis, unspecified organism: Secondary | ICD-10-CM | POA: Diagnosis not present

## 2021-01-06 DIAGNOSIS — M009 Pyogenic arthritis, unspecified: Secondary | ICD-10-CM

## 2021-01-06 DIAGNOSIS — R7881 Bacteremia: Secondary | ICD-10-CM | POA: Diagnosis not present

## 2021-01-06 LAB — COMPREHENSIVE METABOLIC PANEL
ALT: 15 U/L (ref 0–44)
AST: 16 U/L (ref 15–41)
Albumin: 2.2 g/dL — ABNORMAL LOW (ref 3.5–5.0)
Alkaline Phosphatase: 67 U/L (ref 38–126)
Anion gap: 9 (ref 5–15)
BUN: 22 mg/dL (ref 8–23)
CO2: 27 mmol/L (ref 22–32)
Calcium: 8.7 mg/dL — ABNORMAL LOW (ref 8.9–10.3)
Chloride: 102 mmol/L (ref 98–111)
Creatinine, Ser: 0.75 mg/dL (ref 0.61–1.24)
GFR, Estimated: >60 mL/min (ref >60)
Glucose, Bld: 142 mg/dL — ABNORMAL HIGH (ref 70–99)
Potassium: 3.8 mmol/L (ref 3.5–5.1)
Sodium: 138 mmol/L (ref 135–145)
Total Bilirubin: 0.3 mg/dL (ref 0.3–1.2)
Total Protein: 6.4 g/dL — ABNORMAL LOW (ref 6.5–8.1)

## 2021-01-06 LAB — GLUCOSE, CAPILLARY
Glucose-Capillary: 143 mg/dL — ABNORMAL HIGH (ref 70–99)
Glucose-Capillary: 88 mg/dL (ref 70–99)
Glucose-Capillary: 135 mg/dL — ABNORMAL HIGH (ref 70–99)

## 2021-01-06 MED ORDER — FERROUS SULFATE 220 (44 FE) MG/5ML PO ELIX
220.0000 mg | ORAL_SOLUTION | Freq: Every day | ORAL | Status: DC
  Administered 2021-01-07 – 2021-01-09 (×3): 220 mg
  Filled 2021-01-06 (×5): qty 5

## 2021-01-06 MED ORDER — OSMOLITE 1.5 CAL PO LIQD
474.0000 mL | Freq: Three times a day (TID) | ORAL | Status: DC
  Administered 2021-01-06 – 2021-01-23 (×49): 474 mL
  Filled 2021-01-06 (×54): qty 474

## 2021-01-06 MED ORDER — CEFAZOLIN SODIUM-DEXTROSE 2-4 GM/100ML-% IV SOLN
2.0000 g | Freq: Three times a day (TID) | INTRAVENOUS | Status: DC
  Administered 2021-01-06 – 2021-01-08 (×6): 2 g via INTRAVENOUS
  Filled 2021-01-06 (×7): qty 100

## 2021-01-06 MED ORDER — BISACODYL 5 MG PO TBEC
5.0000 mg | DELAYED_RELEASE_TABLET | Freq: Every day | ORAL | Status: DC | PRN
  Administered 2021-01-06 – 2021-01-21 (×4): 5 mg via ORAL
  Filled 2021-01-06 (×4): qty 1

## 2021-01-06 MED ORDER — DOCUSATE SODIUM 100 MG PO CAPS: 100.0000 mg | ORAL_CAPSULE | Freq: Two times a day (BID) | ORAL | Status: DC | PRN

## 2021-01-06 MED ORDER — ESOMEPRAZOLE MAGNESIUM 40 MG PO PACK: 40.0000 mg | PACK | Freq: Every day | ORAL | Status: DC

## 2021-01-06 MED ORDER — PROSOURCE TF PO LIQD
45.0000 mL | Freq: Every day | ORAL | Status: DC
  Administered 2021-01-06 – 2021-01-23 (×18): 45 mL
  Filled 2021-01-06 (×19): qty 45

## 2021-01-06 MED ORDER — PANTOPRAZOLE SODIUM 40 MG PO PACK
40.0000 mg | PACK | Freq: Every day | ORAL | Status: DC
  Administered 2021-01-06 – 2021-01-22 (×17): 40 mg via ORAL
  Filled 2021-01-06 (×17): qty 20

## 2021-01-06 MED ORDER — FLEET ENEMA 7-19 GM/118ML RE ENEM: 1.0000 | ENEMA | Freq: Every day | RECTAL | Status: DC | PRN

## 2021-01-06 NOTE — Hospital Course (Addendum)
White count 16.3---> 7.1 Hemoglobin 12.1-->10.8 Platelet 366   BUNs/creatinine 22/0.7-->18/0.7 CO2 25  IMPRESSIONS     1. Left ventricular ejection fraction, by estimation, is 60 to 65%. The  left ventricle has normal function. The left ventricle has no regional  wall motion abnormalities. There is mild left ventricular hypertrophy.  Left ventricular diastolic parameters  are consistent with Grade I diastolic dysfunction (impaired relaxation).   2. Right ventricular systolic function is normal. The right ventricular  size is normal.   3. The mitral valve is normal in structure. Trivial mitral valve  regurgitation. No evidence of mitral stenosis.   4. The aortic valve is normal in structure. Aortic valve regurgitation is  trivial. No aortic stenosis is present.   5. The inferior vena cava is normal in size with greater than 50%  respiratory variability, suggesting right atrial pressure of 3 mmHg.
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
## 2021-01-06 NOTE — Progress Notes (Signed)
Patient refused BS check and labs this shift.
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
## 2021-01-06 NOTE — Plan of Care (Signed)
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
## 2021-01-06 NOTE — Consult Note (Signed)
Asked to see patient for possible septic wrist - Right. HPI: R wrist pain for months per patient ~November, states he sprained it then gout set in.  He states that wrist is no worse currently than it was a month or so ago. Pt has limited motion of wrist and fingers, which he feels started when he sprained it. Admission notes/consults reviewed.  Pt currently on IV abx . Exam: alert  RUE: wrist swollen, minimal/no erythema, limited rom of fingers and wrist. Some tender to wrist motion, grossly neuro intact L wrist wnl  L Wrist XR- reviewed, joint space narrowing, erosions present  Assessment: L wrist swelling, h/o gout; I do not think this is a septic wrist; this appears more like an arthritic (gout) wrist joint Plan: would provide patient a wrist splint from comfort, due to XR appearance , would be difficult to aspirate joint without imaging.

## 2021-01-07 ENCOUNTER — Inpatient Hospital Stay (HOSPITAL_COMMUNITY): Payer: Medicare Other

## 2021-01-07 DIAGNOSIS — R933 Abnormal findings on diagnostic imaging of other parts of digestive tract: Secondary | ICD-10-CM

## 2021-01-07 DIAGNOSIS — R7881 Bacteremia: Secondary | ICD-10-CM

## 2021-01-07 DIAGNOSIS — A419 Sepsis, unspecified organism: Secondary | ICD-10-CM | POA: Diagnosis not present

## 2021-01-07 DIAGNOSIS — Z86010 Personal history of colonic polyps: Secondary | ICD-10-CM

## 2021-01-07 LAB — COMPREHENSIVE METABOLIC PANEL
ALT: 13 U/L (ref 0–44)
AST: 16 U/L (ref 15–41)
Albumin: 2.2 g/dL — ABNORMAL LOW (ref 3.5–5.0)
Alkaline Phosphatase: 67 U/L (ref 38–126)
Anion gap: 10 (ref 5–15)
BUN: 18 mg/dL (ref 8–23)
CO2: 25 mmol/L (ref 22–32)
Calcium: 8.9 mg/dL (ref 8.9–10.3)
Chloride: 100 mmol/L (ref 98–111)
Creatinine, Ser: 0.7 mg/dL (ref 0.61–1.24)
GFR, Estimated: >60 mL/min (ref >60)
Glucose, Bld: 149 mg/dL — ABNORMAL HIGH (ref 70–99)
Potassium: 3.9 mmol/L (ref 3.5–5.1)
Sodium: 135 mmol/L (ref 135–145)
Total Bilirubin: 0.3 mg/dL (ref 0.3–1.2)
Total Protein: 6.6 g/dL (ref 6.5–8.1)

## 2021-01-07 LAB — CBC WITH DIFFERENTIAL/PLATELET
Abs Immature Granulocytes: 0.07 10*3/uL (ref 0.00–0.07)
Basophils Absolute: 0 10*3/uL (ref 0.0–0.1)
Basophils Relative: 1 %
Eosinophils Absolute: 0.2 10*3/uL (ref 0.0–0.5)
Eosinophils Relative: 3 %
HCT: 33.8 % — ABNORMAL LOW (ref 39.0–52.0)
Hemoglobin: 10.8 g/dL — ABNORMAL LOW (ref 13.0–17.0)
Immature Granulocytes: 1 %
Lymphocytes Relative: 11 %
Lymphs Abs: 0.8 10*3/uL (ref 0.7–4.0)
MCH: 30 pg (ref 26.0–34.0)
MCHC: 32 g/dL (ref 30.0–36.0)
MCV: 93.9 fL (ref 80.0–100.0)
Monocytes Absolute: 0.8 10*3/uL (ref 0.1–1.0)
Monocytes Relative: 11 %
Neutro Abs: 5.3 10*3/uL (ref 1.7–7.7)
Neutrophils Relative %: 73 %
Platelets: 366 10*3/uL (ref 150–400)
RBC: 3.6 MIL/uL — ABNORMAL LOW (ref 4.22–5.81)
RDW: 14.6 % (ref 11.5–15.5)
WBC: 7.1 10*3/uL (ref 4.0–10.5)
nRBC: 0 % (ref 0.0–0.2)

## 2021-01-07 LAB — URINE CULTURE

## 2021-01-07 LAB — GLUCOSE, CAPILLARY
Glucose-Capillary: 142 mg/dL — ABNORMAL HIGH (ref 70–99)
Glucose-Capillary: 140 mg/dL — ABNORMAL HIGH (ref 70–99)
Glucose-Capillary: 113 mg/dL — ABNORMAL HIGH (ref 70–99)
Glucose-Capillary: 166 mg/dL — ABNORMAL HIGH (ref 70–99)
Glucose-Capillary: 140 mg/dL — ABNORMAL HIGH (ref 70–99)

## 2021-01-07 LAB — ECHOCARDIOGRAM COMPLETE
AR max vel: 2.7 cm2
AV Area VTI: 2.51 cm2
AV Area mean vel: 2.6 cm2
AV Mean grad: 3 mmHg
AV Peak grad: 5.9 mmHg
Ao pk vel: 1.21 m/s
Area-P 1/2: 3.99 cm2
Height: 72 in
S' Lateral: 2.5 cm
Weight: 2800 oz

## 2021-01-07 LAB — CULTURE, BLOOD (SINGLE): Special Requests: ADEQUATE

## 2021-01-07 MED ORDER — IOHEXOL 9 MG/ML PO SOLN
ORAL | Status: AC
  Administered 2021-01-07: 1000 mL via ORAL
  Filled 2021-01-07: qty 1000

## 2021-01-07 MED ORDER — IOHEXOL 300 MG/ML  SOLN
75.0000 mL | Freq: Once | INTRAMUSCULAR | Status: AC | PRN
  Administered 2021-01-07: 75 mL via INTRAVENOUS

## 2021-01-07 NOTE — Progress Notes (Signed)
Pt refused blood sugar check this am
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
## 2021-01-07 NOTE — TOC Initial Note (Addendum)
Transition of Care [facility]) - Initial/Assessment Note    Marc Rubio Details  Name: Marc Rubio MRN: [id number] Date of Birth: 08/11/1946  Transition of Care Marc Rubio) CM/SW Contact:    [name], LCSW Phone Number: 01/07/2021, 1:23 PM  Clinical Narrative:     CSW met with pt regarding discharge recommendations.  Pt declines SNF recommendation, open to [facility] referral.  Choice document given, pt would prefer [facility].  Permission given to speak with ex wife, Marc Rubio.  Pt lives with her and reports she is supportive, also three teenage grandchildren in the home.  Pt reports he does have PCP through [facility] but does not recall name-no PCP listed in epic.  Current equipment in the home: walker.  Pt is interested in a beside commode and also requesting a Rubio bed.  Pt is not vaccinated for covid.      CSW spoke with Marc Rubio who confirms that she is able to support pt at home.  She is preparing a bedroom on the main floor for him so he does not have to climb stairs, also requesting the Rubio bed and a walker.  The walker in the home is hers and she does need it sometimes.    Marc Rubio at [facility] reviewing pt.  1400: Marc Rubio/[facility] declines referral due to staffing. Marc Rubio at [facility] accepts referral.  2778: Rubio bed order is in, confirmed with [employer].  They will contact Marc Rubio and can deliver tomorrow.               Expected Discharge Plan: Marc Rubio Barriers to Discharge: Continued Medical Work up   Marc Rubio Goals and CMS Choice Marc Rubio states their goals for this hospitalization and ongoing recovery are:: take care of myself CMS Medicare.gov Compare Post Acute Care list provided to:: Marc Rubio Choice offered to / list presented to : Marc Rubio  Expected Discharge Plan and Services Expected Discharge Plan: [place] Choice: Marc Rubio arrangements for the past 2 months: Single Family Home                                       Prior Living Arrangements/Services Living arrangements for the past 2 months: Single Family Home Lives with:: Other (Comment) (ex wife, 3 teenage grandchildren) Marc Rubio language and need for interpreter reviewed:: Yes Do you feel safe going back to the place where you live?: Yes      Need for Family Participation in Marc Rubio Care: Yes (Comment) Care giver support system in place?: Yes (comment) Current home services: Other (comment) (none) Criminal Activity/Legal Involvement Pertinent to Current Situation/Hospitalization: No - Comment as needed  Activities of Daily Living Home Assistive Devices/Equipment: Other (Comment) ADL Screening (condition at time of admission) Marc Rubio's cognitive ability adequate to safely complete daily activities?: Yes Is the Marc Rubio deaf or have difficulty hearing?: No Does the Marc Rubio have difficulty seeing, even when wearing glasses/contacts?: No Does the Marc Rubio have difficulty concentrating, remembering, or making decisions?: No Marc Rubio able to express need for assistance with ADLs?: Yes Does the Marc Rubio have difficulty dressing or bathing?: Yes Independently performs ADLs?: No Does the Marc Rubio have difficulty walking or climbing stairs?: Yes Weakness of Legs: Both Weakness of Arms/Hands: None  Permission Sought/Granted Permission sought to share information with : Family Supports Permission granted to share information with : Yes, Verbal Permission Granted  Share Information with  NAME: ex wife, Marc Rubio  Permission granted to share info w AGENCY: HH        Emotional Assessment Appearance:: Appears stated age Attitude/Demeanor/Rapport: Engaged Affect (typically observed): Appropriate,Pleasant Orientation: : Oriented to Self,Oriented to Place,Oriented to  Time,Oriented to Situation Alcohol / Substance Use: Not Applicable Psych Involvement: No (comment)  Admission diagnosis:  Intra-abdominal infection [B99.9] Sepsis due to  undetermined organism [facility] Rubio) [A41.9] Marc Rubio Active Problem List   Diagnosis Date Noted  . Bacteremia   . Septic arthritis ([place])   . Intra-abdominal infection 01/05/2021  . Ileus ([place]) 01/05/2021  . DM2 (diabetes mellitus, type 2) ([place]) 01/05/2021  . Sepsis due to undetermined organism ([place]) 01/04/2021   PCP:  Marc Rubio, No Pcp Per (Inactive) Pharmacy:   [id number] - [place], [place] - [address] AT [address] [place] [address] Phone: [phone] Fax: [phone]     Social Determinants of Health (SDOH) Interventions    Readmission Risk Interventions No flowsheet data found.

## 2021-01-07 NOTE — Progress Notes (Signed)
[facility] for Infectious Disease  Date of Admission:  01/04/2021     Total days of antibiotics 4         ASSESSMENT:  Marc Rubio x-ray is concerning for septic arthritis. Orthopedics favoring arthritic (gout) wrist joint as opposed to septic arthritis. Repeat blood cultures are in process and TTE is scheduled for today. Would consider possible IR aspiration of wrist as if wrist is indeed septic it would be the likely source for bacteremia and possible graft infection. Will continue with Cefazolin and monitor cultures for Strep species identification.   PLAN:  1. Continue Cefazolin. 2. Monitor cultures for clearance of bacteremia. 3. Await TEE results to rule out endocarditis. 4. Consider IR aspiration of left wrist for septic arthritis.   Principal Problem:   Sepsis due to undetermined organism [facility]) Active Problems:   Intra-abdominal infection   Ileus ([place])   DM2 (diabetes mellitus, type 2) ([place])   Bacteremia   Septic arthritis ([place])   . allopurinol  300 mg Oral Daily  . colchicine  0.6 mg Oral Daily  . enoxaparin (LOVENOX) injection  40 mg Subcutaneous Q24H  . feeding supplement (OSMOLITE 1.5 CAL)  474 mL Per Tube TID  . feeding supplement (PROSource TF)  45 mL Per Tube Daily  . ferrous sulfate  220 mg Per Tube Daily  . insulin aspart  0-9 Units Subcutaneous Q4H  . pantoprazole sodium  40 mg Oral Daily    SUBJECTIVE:  Afebrile overnight with no acute events. X-rays with findings compatible with septic arthritis with bony involvement and extensive soft tissue swelling of the wrist. Denies fevers or chills. Had previous infection around his G-tube which has since resolved back in December.     No Known Allergies   Review of Systems: Review of Systems  Constitutional: Negative for chills, fever and weight loss.  Respiratory: Negative for cough, shortness of breath and wheezing.   Cardiovascular: Negative for chest pain and leg swelling.   Gastrointestinal: Negative for abdominal pain, constipation, diarrhea, nausea and vomiting.  Skin: Negative for rash.      OBJECTIVE: Vitals:   01/06/21 1927 01/06/21 2315 01/07/21 0331 01/07/21 0746  BP: 137/73 (!) 144/73 126/69 133/72  Pulse: 92 93 92 89  Resp: 20 20 18  (!) 25  Temp: 98.1 F (36.7 C) 98.3 F (36.8 C) 97.8 F (36.6 C) 98.1 F (36.7 C)  TempSrc: Oral Oral Oral Oral  SpO2: 98% 97% 97% 95%  Weight:      Height:       Body mass index is 23.73 kg/m.  Physical Exam Constitutional:      General: He is not in acute distress.    Appearance: He is well-developed.  Cardiovascular:     Rate and Rhythm: Normal rate and regular rhythm.     Heart sounds: Normal heart sounds.  Pulmonary:     Effort: Pulmonary effort is normal.     Breath sounds: Normal breath sounds.  Skin:    General: Skin is warm and dry.  Neurological:     Mental Status: He is alert and oriented to person, place, and time.  Psychiatric:        Behavior: Behavior normal.        Thought Content: Thought content normal.        Judgment: Judgment normal.     Lab Results Lab Results  Component Value Date   WBC 7.1 01/07/2021   HGB 10.8 (L) 01/07/2021   HCT 33.8 (L)  01/07/2021   MCV 93.9 01/07/2021   PLT 366 01/07/2021    Lab Results  Component Value Date   CREATININE 0.70 01/07/2021   BUN 18 01/07/2021   NA 135 01/07/2021   K 3.9 01/07/2021   CL 100 01/07/2021   CO2 25 01/07/2021    Lab Results  Component Value Date   ALT 13 01/07/2021   AST 16 01/07/2021   ALKPHOS 67 01/07/2021   BILITOT 0.3 01/07/2021     Microbiology: Recent Results (from the past 240 hour(s))  Blood culture (routine single)     Status: None (Preliminary result)   Collection Time: 01/04/21  7:35 PM   Specimen: BLOOD LEFT FOREARM  Result Value Ref Range Status   Specimen Description   Final    BLOOD LEFT FOREARM BLOOD Performed at [facility], [address] [place], [place] [address]     Special Requests   Final    BOTTLES DRAWN AEROBIC AND ANAEROBIC Blood Culture adequate volume Performed at [facility], [address] [place], [place] [address]    Culture  Setup Time   Final    GRAM POSITIVE COCCI IN CHAINS IN PAIRS IN BOTH AEROBIC AND ANAEROBIC BOTTLES CRITICAL RESULT CALLED TO, READ BACK BY AND VERIFIED WITH: Alanda Slim PHARMD @1512  01/05/21 EB Performed at [facility], [place] [address], [place], [place] [address]    Culture GRAM POSITIVE COCCI  Final   Report Status PENDING  Incomplete  Blood Culture ID Panel (Reflexed)     Status: Abnormal   Collection Time: 01/04/21  7:35 PM  Result Value Ref Range Status   Enterococcus faecalis NOT DETECTED NOT DETECTED Final   Enterococcus Faecium NOT DETECTED NOT DETECTED Final   Listeria monocytogenes NOT DETECTED NOT DETECTED Final   Staphylococcus species NOT DETECTED NOT DETECTED Final   Staphylococcus aureus (BCID) NOT DETECTED NOT DETECTED Final   Staphylococcus epidermidis NOT DETECTED NOT DETECTED Final   Staphylococcus lugdunensis NOT DETECTED NOT DETECTED Final   Streptococcus species DETECTED (A) NOT DETECTED Final    Comment: Not Enterococcus species, Streptococcus agalactiae, Streptococcus pyogenes, or Streptococcus pneumoniae. CRITICAL RESULT CALLED TO, READ BACK BY AND VERIFIED WITH: CATHY PIERCE PHARMD @1512  01/05/21 EB    Streptococcus agalactiae NOT DETECTED NOT DETECTED Final   Streptococcus pneumoniae NOT DETECTED NOT DETECTED Final   Streptococcus pyogenes NOT DETECTED NOT DETECTED Final   A.calcoaceticus-baumannii NOT DETECTED NOT DETECTED Final   Bacteroides fragilis NOT DETECTED NOT DETECTED Final   Enterobacterales NOT DETECTED NOT DETECTED Final   Enterobacter cloacae complex NOT DETECTED NOT DETECTED Final   Escherichia coli NOT DETECTED NOT DETECTED Final   Klebsiella aerogenes NOT DETECTED NOT DETECTED Final   Klebsiella oxytoca NOT DETECTED NOT DETECTED Final    Klebsiella pneumoniae NOT DETECTED NOT DETECTED Final   Proteus species NOT DETECTED NOT DETECTED Final   Salmonella species NOT DETECTED NOT DETECTED Final   Serratia marcescens NOT DETECTED NOT DETECTED Final   Haemophilus influenzae NOT DETECTED NOT DETECTED Final   Neisseria meningitidis NOT DETECTED NOT DETECTED Final   Pseudomonas aeruginosa NOT DETECTED NOT DETECTED Final   Stenotrophomonas maltophilia NOT DETECTED NOT DETECTED Final   Candida albicans NOT DETECTED NOT DETECTED Final   Candida auris NOT DETECTED NOT DETECTED Final   Candida glabrata NOT DETECTED NOT DETECTED Final   Candida krusei NOT DETECTED NOT DETECTED Final   Candida parapsilosis NOT DETECTED NOT DETECTED Final   Candida tropicalis NOT  DETECTED NOT DETECTED Final   Cryptococcus neoformans/gattii NOT DETECTED NOT DETECTED Final    Comment: Performed at [facility], [place] [address] [place], [place] [address]  Culture, blood (single)     Status: None (Preliminary result)   Collection Time: 01/04/21  9:00 PM   Specimen: BLOOD RIGHT HAND  Result Value Ref Range Status   Specimen Description   Final    BLOOD RIGHT HAND BLOOD Performed at [facility], [address] [place], [place] [address]    Special Requests   Final    BOTTLES DRAWN AEROBIC ONLY Blood Culture adequate volume Performed at [facility], [address] [place], [place] [address]    Culture  Setup Time GRAM POSITIVE COCCI AEROBIC BOTTLE ONLY   Final   Culture   Final    NO GROWTH < 24 HOURS Performed at [facility], [place] [address] [place], [place] [address]    Report Status PENDING  Incomplete  Resp Panel by RT-PCR (Flu A&B, Covid) Nasopharyngeal Swab     Status: None   Collection Time: 01/04/21  9:25 PM   Specimen: Nasopharyngeal Swab; Nasopharyngeal(NP) swabs in vial transport medium  Result Value Ref Range Status   SARS Coronavirus 2 by RT PCR NEGATIVE NEGATIVE Final    Comment:  (NOTE) SARS-CoV-2 target nucleic acids are NOT DETECTED.  The SARS-CoV-2 RNA is generally detectable in upper respiratory specimens during the acute phase of infection. The lowest concentration of SARS-CoV-2 viral copies this assay can detect is 138 copies/mL. A negative result does not preclude SARS-Cov-2 infection and should not be used as the sole basis for treatment or other patient management decisions. A negative result may occur with  improper specimen collection/handling, submission of specimen other than nasopharyngeal swab, presence of viral mutation(s) within the areas targeted by this assay, and inadequate number of viral copies(<138 copies/mL). A negative result must be combined with clinical observations, patient history, and epidemiological information. The expected result is Negative.  Fact Sheet for Patients:  EntrepreneurPulse.com.au  Fact Sheet for Healthcare Providers:  IncredibleEmployment.be  This test is no t yet approved or cleared by the [place] FDA and  has been authorized for detection and/or diagnosis of SARS-CoV-2 by FDA under an Emergency Use Authorization (EUA). This EUA will remain  in effect (meaning this test can be used) for the duration of the COVID-19 declaration under Section 564(b)(1) of the Act, 21 U.S.C.section 360bbb-3(b)(1), unless the authorization is terminated  or revoked sooner.       Influenza A by PCR NEGATIVE NEGATIVE Final   Influenza B by PCR NEGATIVE NEGATIVE Final    Comment: (NOTE) The Xpert Xpress SARS-CoV-2/FLU/RSV plus assay is intended as an aid in the diagnosis of influenza from Nasopharyngeal swab specimens and should not be used as a sole basis for treatment. Nasal washings and aspirates are unacceptable for Xpert Xpress SARS-CoV-2/FLU/RSV testing.  Fact Sheet for Patients: EntrepreneurPulse.com.au  Fact Sheet for Healthcare  Providers: IncredibleEmployment.be  This test is not yet approved or cleared by the [place] FDA and has been authorized for detection and/or diagnosis of SARS-CoV-2 by FDA under an Emergency Use Authorization (EUA). This EUA will remain in effect (meaning this test can be used) for the duration of the COVID-19 declaration under Section 564(b)(1) of the Act, 21 U.S.C. section 360bbb-3(b)(1), unless the authorization is terminated or revoked.  Performed at [facility], [address] [place], [place]  [address]   Urine culture     Status: Abnormal   Collection Time: 01/05/21 12:15 AM   Specimen: In/Out Cath Urine  Result Value Ref Range Status   Specimen Description   Final    IN/OUT CATH URINE Performed at [facility], [address] [place], [place] [address]    Special Requests   Final    NONE Performed at [facility], [address] [place], [place] [address]    Culture MULTIPLE SPECIES PRESENT, SUGGEST RECOLLECTION (A)  Final   Report Status 01/07/2021 FINAL  Final  MRSA PCR Screening     Status: None   Collection Time: 01/05/21  2:21 AM   Specimen: Nasopharyngeal  Result Value Ref Range Status   MRSA by PCR NEGATIVE NEGATIVE Final    Comment:        The GeneXpert MRSA Assay (FDA approved for NASAL specimens only), is one component of a comprehensive MRSA colonization surveillance program. It is not intended to diagnose MRSA infection nor to guide or monitor treatment for MRSA infections. Performed at [facility], [place] [address] [place], [place] [address]      Terri Piedra, [place] for Infectious Disease [facility]  01/07/2021  9:06 AM

## 2021-01-07 NOTE — Progress Notes (Incomplete)
  Echocardiogram 2D Echocardiogram has been performed.  Cammy Brochure 01/07/2021, 3:40 PM

## 2021-01-07 NOTE — Progress Notes (Signed)
Physical Therapy Treatment Patient Details Name: Marc Rubio MRN: [id number] DOB: [birth year] Today's Date: 01/07/2021    History of Present Illness 75 yo male with onset of L side back pain was admitted, has concern for intra abd infection with sepsis, noted aortic infection, ileus. Lt wrist gout vs septic arthritis PMHx:  abd aortic injury, endovasc stent graft repair, SCC of tonsil, dysphagia, PEG tube, chronic ileus, DM, L wrist gout, CAD, pulm fibrosis    PT Comments    Patient willing to get OOB and performed LE exercises (agreeing to try to keep his strength up for ascending steps to get into home--of note, plan is for him to now stay on first floor with hospital bed).    Follow Up Recommendations  Home health PT (pt refused SNF)     Equipment Recommendations  Hospital bed;Rolling walker with 5" wheels (walker he has been using not his and not available)    Recommendations for Other Services       Precautions / Restrictions Precautions Precautions: Fall Precaution Comments: monitor pain in back Restrictions Other Position/Activity Restrictions: back pain    Mobility  Bed Mobility Overal bed mobility: Needs Assistance Bed Mobility: Rolling;Supine to Sit Rolling: Min guard   Supine to sit: Min guard     General bed mobility comments: no cues needed; +rail, HOB 20    Transfers Overall transfer level: Needs assistance Equipment used: Rolling walker (2 wheeled) Transfers: Sit to/from [facility] Sit to Stand: Min assist Stand pivot transfers: Min guard       General transfer comment: stabilized one side of RW as pt unable to push with left wrist/UE; no assist needed to maneuver RW  Ambulation/Gait Ambulation/Gait assistance: Min assist Gait Distance (Feet): 5 Feet Assistive device: Rolling walker (2 wheeled);2 person hand held assist Gait Pattern/deviations: Step-to pattern;Step-through pattern;Decreased stride length;Wide base of  support Gait velocity: reduced, variable   General Gait Details: limited by room set up and patient's limited patience   Stairs             Wheelchair Mobility    Modified Rankin (Stroke Patients Only)       Balance Overall balance assessment: Needs assistance Sitting-balance support: Bilateral upper extremity supported;Feet supported Sitting balance-Leahy Scale: Fair     Standing balance support: Bilateral upper extremity supported Standing balance-Leahy Scale: Poor                              Cognition Arousal/Alertness: Awake/alert Behavior During Therapy: WFL for tasks assessed/performed Overall Cognitive Status: No family/caregiver present to determine baseline cognitive functioning                                 General Comments: A/Ox4; could have distorted sense of safety and his abilities      Exercises General Exercises - Lower Extremity Ankle Circles/Pumps: AROM;5 reps Long Arc Quad: AROM;Both;10 reps Hip Flexion/Marching: AROM;Both;5 reps Other Exercises Other Exercises: sit to stand x 2 reps from recliner    General Comments        Pertinent Vitals/Pain Pain Assessment: Faces Faces Pain Scale: Hurts little more Pain Location: left wrist Pain Descriptors / Indicators: Guarding;Discomfort Pain Intervention(s): Limited activity within patient's tolerance    Home Living                      Prior Function  PT Goals (current goals can now be found in the care plan section) Acute Rehab PT Goals Patient Stated Goal: to go home Progress towards PT goals: Progressing toward goals    Frequency    Min 3X/week      PT Plan Discharge plan needs to be updated (pt refusing SNF; family reports they can provide assist)    Co-evaluation              AM-PAC PT "6 Clicks" Mobility   Outcome Measure  Help needed turning from your back to your side while in a flat bed without using  bedrails?: A Little Help needed moving from lying on your back to sitting on the side of a flat bed without using bedrails?: A Little Help needed moving to and from a bed to a chair (including a wheelchair)?: A Little Help needed standing up from a chair using your arms (e.g., wheelchair or bedside chair)?: A Little Help needed to walk in hospital room?: A Little Help needed climbing 3-5 steps with a railing? : A Lot 6 Click Score: 17    End of Session Equipment Utilized During Treatment: Gait belt Activity Tolerance: Patient limited by fatigue Patient left: in chair;with call bell/phone within reach;with chair alarm set Nurse Communication: Mobility status PT Visit Diagnosis: Unsteadiness on feet (R26.81);Muscle weakness (generalized) (M62.81)     Time: 9622-2979 PT Time Calculation (min) (ACUTE ONLY): 20 min  Charges:  $Therapeutic Exercise: 8-22 mins                      Arby Barrette, PT Pager [phone]    Rexanne Mano 01/07/2021, 3:47 PM

## 2021-01-07 NOTE — Progress Notes (Signed)
Pt. Refused Insulin.

## 2021-01-07 NOTE — Progress Notes (Signed)
Pt refused insulin. Pt stated "not about to start this mess" asked why he needed insulin. I explained to pt its importance, pt still refused. Will let Doctor know.

## 2021-01-08 ENCOUNTER — Inpatient Hospital Stay (HOSPITAL_COMMUNITY): Payer: Medicare Other

## 2021-01-08 DIAGNOSIS — A419 Sepsis, unspecified organism: Secondary | ICD-10-CM | POA: Diagnosis not present

## 2021-01-08 DIAGNOSIS — R7881 Bacteremia: Secondary | ICD-10-CM | POA: Diagnosis not present

## 2021-01-08 DIAGNOSIS — R933 Abnormal findings on diagnostic imaging of other parts of digestive tract: Secondary | ICD-10-CM | POA: Diagnosis not present

## 2021-01-08 DIAGNOSIS — Z86010 Personal history of colonic polyps: Secondary | ICD-10-CM | POA: Diagnosis not present

## 2021-01-08 LAB — SYNOVIAL CELL COUNT + DIFF, W/ CRYSTALS: Crystals, Fluid: NONE SEEN

## 2021-01-08 LAB — CULTURE, BLOOD (SINGLE): Special Requests: ADEQUATE

## 2021-01-08 LAB — GLUCOSE, CAPILLARY
Glucose-Capillary: 117 mg/dL — ABNORMAL HIGH (ref 70–99)
Glucose-Capillary: 132 mg/dL — ABNORMAL HIGH (ref 70–99)

## 2021-01-08 MED ORDER — PENICILLIN G POTASSIUM 20000000 UNITS IJ SOLR
12.0000 10*6.[IU] | Freq: Two times a day (BID) | INTRAVENOUS | Status: DC
  Administered 2021-01-08 – 2021-01-23 (×30): 12 10*6.[IU] via INTRAVENOUS
  Filled 2021-01-08: qty 5
  Filled 2021-01-08 (×5): qty 12
  Filled 2021-01-08: qty 5
  Filled 2021-01-08 (×3): qty 12
  Filled 2021-01-08: qty 5
  Filled 2021-01-08 (×19): qty 12
  Filled 2021-01-08: qty 7
  Filled 2021-01-08 (×6): qty 12

## 2021-01-08 MED ORDER — IOHEXOL 180 MG/ML  SOLN
20.0000 mL | Freq: Once | INTRAMUSCULAR | Status: AC | PRN
  Administered 2021-01-08: 1 mL via INTRATHECAL

## 2021-01-08 MED ORDER — LIDOCAINE HCL (PF) 1 % IJ SOLN
5.0000 mL | Freq: Once | INTRAMUSCULAR | Status: AC
  Administered 2021-01-08: 5 mL via INTRADERMAL

## 2021-01-08 NOTE — Progress Notes (Signed)
Pt refused am blood sugar check again.

## 2021-01-08 NOTE — Progress Notes (Signed)
Lab called, notified nurse that Pt refused lab draw.

## 2021-01-08 NOTE — Progress Notes (Addendum)
Attending physician's note   I have taken an interval history, reviewed the chart and examined the patient. I agree with the Advanced Practitioner's note, impression, and recommendations as outlined.   Repeat CT with rectal contrast no longer demonstrates lesion in the distal sigmoid wall.  Does have some evidence of chronic colonic ileus changes, but no transition zone or obstructive appearance (he has a longstanding history of chronic colonic ileus).  No plan for colonoscopy at this juncture.  I discussed these results with the patient at length, and he very much would like to avoid colonoscopy unless absolutely necessary.  GI service will sign off at this time.  Please do not hesitate to contact us with additional questions or concerns.  Yeimy Brabant, DO, FACG [phone] office                                                                      Daily Rounding Note  01/08/2021, 8:42 AM  LOS: 3 days   SUBJECTIVE:   Chief complaint:   Sigmoid mass?  No complaints  OBJECTIVE:         Vital signs in last 24 hours:    Temp:  [98 F (36.7 C)-99.2 F (37.3 C)] 98.3 F (36.8 C) (05/04 0743) Pulse Rate:  [89-92] 89 (05/04 0743) Resp:  [20-23] 23 (05/04 0743) BP: (129-163)/(68-86) 163/84 (05/04 0743) SpO2:  [96 %-98 %] 98 % (05/04 0308) Last BM Date: 01/06/21 Filed Weights   01/04/21 1721  Weight: 79.4 kg   General: Looks chronically ill but comfortable and alert. Did not reexamine patient   Intake/Output from previous day: 05/03 0701 - 05/04 0700 In: 400 [IV Piggyback:400] Out: 920 [Urine:920]  Intake/Output this shift: No intake/output data recorded.  Lab Results: Recent Labs    01/07/21 0443  WBC 7.1  HGB 10.8*  HCT 33.8*  PLT 366   BMET Recent Labs    01/06/21 0807 01/07/21 0443  NA 138 135  K 3.8 3.9  CL 102 100  CO2 27 25  GLUCOSE 142* 149*  BUN 22 18  CREATININE 0.75 0.70  CALCIUM 8.7* 8.9   LFT Recent Labs    01/06/21 0807  01/07/21 0443  PROT 6.4* 6.6  ALBUMIN 2.2* 2.2*  AST 16 16  ALT 15 13  ALKPHOS 67 67  BILITOT 0.3 0.3   PT/INR No results for input(s): LABPROT, INR in the last 72 hours. Hepatitis Panel No results for input(s): HEPBSAG, HCVAB, HEPAIGM, HEPBIGM in the last 72 hours.  Studies/Results: DG Wrist 2 Views Left  Result Date: 01/06/2021 CLINICAL DATA:  Septic arthritis, bacteremia EXAM: LEFT WRIST - 2 VIEW COMPARISON:  Radiograph 08/10/2020 FINDINGS: There is new radiocarpal, DRUJ, and intercarpal joint space loss with cortical regularity and osseous erosion. There is also joint space narrowing of the second and third carpometacarpal joints. This is rapidly progressed since August 10, 2020. There is extensive soft tissue swelling along the wrist. 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 Joint destruction is new since December 2021. These results will be called to the ordering clinician or representative by the Radiologist Assistant, and communication documented in the PACS or [employer]. Electronically Signed  By: Maurine Simmering   On: 01/06/2021 15:46   CT ABDOMEN PELVIS W CONTRAST  Result Date: 01/08/2021 CLINICAL DATA:  Left lower quadrant pain. EXAM: CT ABDOMEN AND PELVIS WITH CONTRAST TECHNIQUE: Multidetector CT imaging of the abdomen and pelvis was performed using the standard protocol following bolus administration of intravenous contrast. CONTRAST:  69mL OMNIPAQUE IOHEXOL 300 MG/ML  SOLN COMPARISON:  January 04, 2021 FINDINGS: Lower chest: Mild areas of scarring and/or atelectasis are seen within the bilateral lung bases. Hepatobiliary: No focal liver abnormality is seen. No gallstones, gallbladder wall thickening, or biliary dilatation. Pancreas: Unremarkable. No pancreatic ductal dilatation or surrounding inflammatory changes. Spleen: Normal in size without focal abnormality. Adrenals/Urinary  Tract: Adrenal glands are unremarkable. Kidneys are normal, without renal calculi, focal lesion, or hydronephrosis. Bladder is unremarkable. Stomach/Bowel: A percutaneous gastrostomy tube is again seen with its distal tip and insufflator bulb noted within the gastric lumen. The appendix is not clearly identified. The small bowel is opacified and normal in caliber. Mildly prominent loops of air and fluid filled large bowel are again seen. The transition zone noted on the prior study is no longer present. Noninflamed diverticula are noted throughout the sigmoid colon. The area of low attenuation seen within the wall of the distal sigmoid colon on the prior study is no longer visualized. Vascular/Lymphatic: Prior stenting of the infrarenal abdominal aorta and bilateral common iliac arteries is noted. A stable 4.2 cm x 1.4 cm area of low attenuation is seen along the anterior para-aortic and aortocaval region (axial CT images 39 through 46, CT series number 3). No enlarged abdominal or pelvic lymph nodes are identified. Reproductive: Moderate to marked severity prostate gland enlargement is seen. Other: No abdominal wall hernia or abnormality. No abdominopelvic ascites. Musculoskeletal: A chronic compression fracture deformity is seen at the level of the L4 vertebral body. Multilevel degenerative changes seen throughout the lumbar spine. IMPRESSION: 1. Evidence of prior endovascular repair of the infrarenal abdominal aorta with a stable area of para-aortic and aortocaval low-attenuation. Correlation with time interval follow-up is recommended to confirm stability. 2. Stable mildly prominent loops of large bowel in the absence of a transition zone, likely chronic in nature. 3. Sigmoid diverticulosis. 4. Nonvisualization of the area of low attenuation suspected within the wall of the distal sigmoid colon on the prior study, likely consistent with an area of volume averaging. Electronically Signed   By: Virgina Norfolk  M.D.   On: 01/08/2021 02:09   CT L-SPINE NO CHARGE  Result Date: 01/08/2021 CLINICAL DATA:  Low back pain EXAM: CT LUMBAR SPINE WITHOUT CONTRAST TECHNIQUE: Multidetector CT imaging of the lumbar spine was performed without intravenous contrast administration. Multiplanar CT image reconstructions were also generated. COMPARISON:  None. FINDINGS: Segmentation: 5 lumbar type vertebrae. Alignment: Normal. Vertebrae: Multilevel vertebral body height loss, greatest at L4. No acute fracture. No discitis-osteomyelitis. Paraspinal and other soft tissues: Please refer to report for CT abdomen pelvis from which this study was generated. Disc levels: Multilevel degenerative disc disease without high-grade spinal canal stenosis. IMPRESSION: 1. No acute fracture or static subluxation of the lumbar spine. 2. 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 Electronically Signed   By: Ulyses Jarred M.D.   On: 01/08/2021 03:13   ECHOCARDIOGRAM COMPLETE  Result Date: 01/07/2021 IMPRESSIONS  1. Left ventricular ejection fraction, by estimation, is 60 to 65%. The left ventricle has normal function. The left ventricle has no regional wall motion abnormalities. There is mild left ventricular hypertrophy. Left ventricular  diastolic parameters are consistent with Grade I diastolic dysfunction (impaired relaxation).  2. Right ventricular systolic function is normal. The right ventricular size is normal.  3. The mitral valve is normal in structure. Trivial mitral valve regurgitation. No evidence of mitral stenosis.  4. The aortic valve is normal in structure. Aortic valve regurgitation is trivial. No aortic stenosis is present.  5. The inferior vena cava is normal in size with greater than 50% respiratory variability, suggesting right atrial pressure of 3 mmHg. Electronically signed by Candee Furbish MD Signature Date/Time: 01/07/2021/3:43:01 PM    Final     ASSESMENT:   *   Sigmoid lesion per initial CT.    On CT # 2 no sigmoid lesion visualized.  *    chronic colonic ileus dating back years.  No obstructive symptoms.  Tolerating tube feeds as always.  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  Status post G-tube many years ago.  *    back pain.  Imaging confirms degenerative spine disease.  *   Left wrist septic arthritis per imaging.     PLAN   *   No role for repeating colonoscopy.  This was last performed in 2019, adenomatous polyps removed.  GI [facility] in [place].  Aged out of surveillance colonoscopy. GI will sign off.      Azucena Freed  01/08/2021, 8:42 AM Phone [phone]

## 2021-01-08 NOTE — Progress Notes (Signed)
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 MV Area (PHT): 3.99 cm     SHUNTS MV Decel Time: 190 msec     Systemic VTI:  0.18 m MV E velocity: 53.30 cm/s   Systemic Diam: 2.00 cm MV A velocity: 104.00 cm/s MV E/A ratio:  0.51 Candee Furbish MD Electronically signed by Candee Furbish MD Signature Date/Time: 01/07/2021/3:43:01 PM    Final     Scheduled Meds: . allopurinol  300 mg Oral Daily  . colchicine  0.6 mg Oral Daily  . enoxaparin (LOVENOX) injection  40 mg Subcutaneous Q24H  . feeding supplement (OSMOLITE 1.5 CAL)  474 mL Per Tube TID  . feeding supplement (PROSource TF)  45 mL Per Tube Daily  . ferrous sulfate  220 mg Per Tube Daily  . insulin aspart  0-9 Units Subcutaneous Q4H  . pantoprazole sodium  40 mg Oral Daily    Continuous Infusions: . sodium chloride Stopped (01/04/21 2347)  . lactated ringers 100 mL/hr at 01/05/21 0224  . penicillin g continuous IV infusion 12 Million Units (01/08/21 1542)     LOS: 3 days   Marylu Lund, MD [facility] Pager On Amion  If 7PM-7AM, please contact night-coverage 01/08/2021, 5:03 PM

## 2021-01-08 NOTE — Progress Notes (Signed)
[facility] for Infectious Disease  Date of Admission:  01/04/2021     Total days of antibiotics 5         ASSESSMENT:  Marc Rubio TTE is negative for endocarditis and will need TEE for confirmation. Blood cultures from 5/2 remain without growth to date. Cultures now growing Streptococcus Group G. Will change antibiotics to Penicillin G. Ultimately will need prolonged therapy and suppression following treatment. CT with concern for sigmoid lesion not seen on most recent CT. Continue medical management per primary team.   PLAN:  1. Change cefazolin to Penicillin. 2. TEE to rule out endocarditis.  3. Monitor blood cultures for clearance of bacteremia.  4. Await assessment by IR.   Principal Problem:   Sepsis due to undetermined organism [facility]) Active Problems:   Intra-abdominal infection   Ileus ([place])   DM2 (diabetes mellitus, type 2) ([place])   Bacteremia   Septic arthritis ([place])   Abnormal finding on GI tract imaging   History of colonic polyps   . allopurinol  300 mg Oral Daily  . colchicine  0.6 mg Oral Daily  . enoxaparin (LOVENOX) injection  40 mg Subcutaneous Q24H  . feeding supplement (OSMOLITE 1.5 CAL)  474 mL Per Tube TID  . feeding supplement (PROSource TF)  45 mL Per Tube Daily  . ferrous sulfate  220 mg Per Tube Daily  . insulin aspart  0-9 Units Subcutaneous Q4H  . pantoprazole sodium  40 mg Oral Daily    SUBJECTIVE:  Afebrile overnight with no acute events. Has refused some labs today. Denies fevers or chill. No changes in left wrist.   No Known Allergies   Review of Systems: Review of Systems  Constitutional: Negative for chills, fever and weight loss.  Respiratory: Negative for cough, shortness of breath and wheezing.   Cardiovascular: Negative for chest pain and leg swelling.  Gastrointestinal: Negative for abdominal pain, constipation, diarrhea, nausea and vomiting.  Skin: Negative for rash.      OBJECTIVE: Vitals:   01/07/21 1954  01/07/21 2300 01/08/21 0308 01/08/21 0743  BP: 140/68 129/86 135/68 (!) 163/84  Pulse: 92 92 91 89  Resp: 20 20 20  (!) 23  Temp: 98.7 F (37.1 C) 98.4 F (36.9 C) 98 F (36.7 C) 98.3 F (36.8 C)  TempSrc: Oral Oral Oral Oral  SpO2: 98% 98% 98%   Weight:      Height:       Body mass index is 23.73 kg/m.  Physical Exam Constitutional:      General: He is not in acute distress.    Appearance: He is well-developed.  Cardiovascular:     Rate and Rhythm: Normal rate and regular rhythm.     Heart sounds: Normal heart sounds.  Pulmonary:     Effort: Pulmonary effort is normal.     Breath sounds: Normal breath sounds.  Abdominal:     Comments: G-tube without evidence of infection.   Skin:    General: Skin is warm and dry.  Neurological:     Mental Status: He is alert and oriented to person, place, and time.  Psychiatric:        Behavior: Behavior normal.        Thought Content: Thought content normal.        Judgment: Judgment normal.     Lab Results Lab Results  Component Value Date   WBC 7.1 01/07/2021   HGB 10.8 (L) 01/07/2021   HCT 33.8 (L) 01/07/2021   MCV  93.9 01/07/2021   PLT 366 01/07/2021    Lab Results  Component Value Date   CREATININE 0.70 01/07/2021   BUN 18 01/07/2021   NA 135 01/07/2021   K 3.9 01/07/2021   CL 100 01/07/2021   CO2 25 01/07/2021    Lab Results  Component Value Date   ALT 13 01/07/2021   AST 16 01/07/2021   ALKPHOS 67 01/07/2021   BILITOT 0.3 01/07/2021     Microbiology: Recent Results (from the past 240 hour(s))  Blood culture (routine single)     Status: Abnormal   Collection Time: 01/04/21  7:35 PM   Specimen: BLOOD LEFT FOREARM  Result Value Ref Range Status   Specimen Description BLOOD LEFT FOREARM BLOOD  Final   Special Requests   Final    BOTTLES DRAWN AEROBIC AND ANAEROBIC Blood Culture adequate volume   Culture  Setup Time   Final    GRAM POSITIVE COCCI IN CHAINS IN PAIRS IN BOTH AEROBIC AND ANAEROBIC  BOTTLES CRITICAL RESULT CALLED TO, READ BACK BY AND VERIFIED WITH: CATHY PIERCE PHARMD @1512  01/05/21 EB    Culture STREPTOCOCCUS GROUP G (A)  Final   Report Status 01/07/2021 FINAL  Final   Organism ID, Bacteria STREPTOCOCCUS GROUP G  Final      Susceptibility   Streptococcus group g - MIC*    CLINDAMYCIN <=0.25 SENSITIVE Sensitive     AMPICILLIN <=0.25 SENSITIVE Sensitive     ERYTHROMYCIN <=0.12 SENSITIVE Sensitive     VANCOMYCIN 0.5 SENSITIVE Sensitive     CEFTRIAXONE <=0.12 SENSITIVE Sensitive     LEVOFLOXACIN 0.5 SENSITIVE Sensitive     PENICILLIN Value in next row Sensitive      SENSITIVE<=0.06Performed at [facility], [address], [place], [place] [address]    * STREPTOCOCCUS GROUP G  Blood Culture ID Panel (Reflexed)     Status: Abnormal   Collection Time: 01/04/21  7:35 PM  Result Value Ref Range Status   Enterococcus faecalis NOT DETECTED NOT DETECTED Final   Enterococcus Faecium NOT DETECTED NOT DETECTED Final   Listeria monocytogenes NOT DETECTED NOT DETECTED Final   Staphylococcus species NOT DETECTED NOT DETECTED Final   Staphylococcus aureus (BCID) NOT DETECTED NOT DETECTED Final   Staphylococcus epidermidis NOT DETECTED NOT DETECTED Final   Staphylococcus lugdunensis NOT DETECTED NOT DETECTED Final   Streptococcus species DETECTED (A) NOT DETECTED Final    Comment: Not Enterococcus species, Streptococcus agalactiae, Streptococcus pyogenes, or Streptococcus pneumoniae. CRITICAL RESULT CALLED TO, READ BACK BY AND VERIFIED WITH: CATHY PIERCE PHARMD @1512  01/05/21 EB    Streptococcus agalactiae NOT DETECTED NOT DETECTED Final   Streptococcus pneumoniae NOT DETECTED NOT DETECTED Final   Streptococcus pyogenes NOT DETECTED NOT DETECTED Final   A.calcoaceticus-baumannii NOT DETECTED NOT DETECTED Final   Bacteroides fragilis NOT DETECTED NOT DETECTED Final   Enterobacterales NOT DETECTED NOT DETECTED Final   Enterobacter cloacae complex NOT DETECTED NOT DETECTED  Final   Escherichia coli NOT DETECTED NOT DETECTED Final   Klebsiella aerogenes NOT DETECTED NOT DETECTED Final   Klebsiella oxytoca NOT DETECTED NOT DETECTED Final   Klebsiella pneumoniae NOT DETECTED NOT DETECTED Final   Proteus species NOT DETECTED NOT DETECTED Final   Salmonella species NOT DETECTED NOT DETECTED Final   Serratia marcescens NOT DETECTED NOT DETECTED Final   Haemophilus influenzae NOT DETECTED NOT DETECTED Final   Neisseria meningitidis NOT DETECTED NOT DETECTED Final   Pseudomonas aeruginosa NOT DETECTED NOT DETECTED Final   Stenotrophomonas maltophilia NOT  DETECTED NOT DETECTED Final   Candida albicans NOT DETECTED NOT DETECTED Final   Candida auris NOT DETECTED NOT DETECTED Final   Candida glabrata NOT DETECTED NOT DETECTED Final   Candida krusei NOT DETECTED NOT DETECTED Final   Candida parapsilosis NOT DETECTED NOT DETECTED Final   Candida tropicalis NOT DETECTED NOT DETECTED Final   Cryptococcus neoformans/gattii NOT DETECTED NOT DETECTED Final    Comment: Performed at [facility], [place] [address] [place], [place] [address]  Culture, blood (single)     Status: Abnormal   Collection Time: 01/04/21  9:00 PM   Specimen: BLOOD RIGHT HAND  Result Value Ref Range Status   Specimen Description   Final    BLOOD RIGHT HAND BLOOD Performed at [facility], [address] [place], [place] [address]    Special Requests   Final    BOTTLES DRAWN AEROBIC ONLY Blood Culture adequate volume Performed at [facility], [address] [place], [place] [address]    Culture  Setup Time GRAM POSITIVE COCCI AEROBIC BOTTLE ONLY   Final   Culture (A)  Final    STREPTOCOCCUS GROUP G SUSCEPTIBILITIES PERFORMED ON PREVIOUS CULTURE WITHIN THE LAST 5 DAYS. Performed at [facility], [place] [address] [place], [place] [address]    Report Status 01/07/2021 FINAL  Final  Resp Panel by RT-PCR (Flu A&B, Covid) Nasopharyngeal Swab     Status: None    Collection Time: 01/04/21  9:25 PM   Specimen: Nasopharyngeal Swab; Nasopharyngeal(NP) swabs in vial transport medium  Result Value Ref Range Status   SARS Coronavirus 2 by RT PCR NEGATIVE NEGATIVE Final    Comment: (NOTE) SARS-CoV-2 target nucleic acids are NOT DETECTED.  The SARS-CoV-2 RNA is generally detectable in upper respiratory specimens during the acute phase of infection. The lowest concentration of SARS-CoV-2 viral copies this assay can detect is 138 copies/mL. A negative result does not preclude SARS-Cov-2 infection and should not be used as the sole basis for treatment or other patient management decisions. A negative result may occur with  improper specimen collection/handling, submission of specimen other than nasopharyngeal swab, presence of viral mutation(s) within the areas targeted by this assay, and inadequate number of viral copies(<138 copies/mL). A negative result must be combined with clinical observations, patient history, and epidemiological information. The expected result is Negative.  Fact Sheet for Patients:  EntrepreneurPulse.com.au  Fact Sheet for Healthcare Providers:  IncredibleEmployment.be  This test is no t yet approved or cleared by the [place] FDA and  has been authorized for detection and/or diagnosis of SARS-CoV-2 by FDA under an Emergency Use Authorization (EUA). This EUA will remain  in effect (meaning this test can be used) for the duration of the COVID-19 declaration under Section 564(b)(1) of the Act, 21 U.S.C.section 360bbb-3(b)(1), unless the authorization is terminated  or revoked sooner.       Influenza A by PCR NEGATIVE NEGATIVE Final   Influenza B by PCR NEGATIVE NEGATIVE Final    Comment: (NOTE) The Xpert Xpress SARS-CoV-2/FLU/RSV plus assay is intended as an aid in the diagnosis of influenza from Nasopharyngeal swab specimens and should not be used as a sole basis for treatment.  Nasal washings and aspirates are unacceptable for Xpert Xpress SARS-CoV-2/FLU/RSV testing.  Fact Sheet for Patients: EntrepreneurPulse.com.au  Fact Sheet for Healthcare Providers: IncredibleEmployment.be  This test is not yet approved or cleared by the [place] FDA and has been authorized for detection and/or  diagnosis of SARS-CoV-2 by FDA under an Emergency Use Authorization (EUA). This EUA will remain in effect (meaning this test can be used) for the duration of the COVID-19 declaration under Section 564(b)(1) of the Act, 21 U.S.C. section 360bbb-3(b)(1), unless the authorization is terminated or revoked.  Performed at [facility], [address] [place], [place] [address]   Urine culture     Status: Abnormal   Collection Time: 01/05/21 12:15 AM   Specimen: In/Out Cath Urine  Result Value Ref Range Status   Specimen Description   Final    IN/OUT CATH URINE Performed at [facility], [address] [place], [place] [address]    Special Requests   Final    NONE Performed at [facility], [address] [place], [place] [address]    Culture MULTIPLE SPECIES PRESENT, SUGGEST RECOLLECTION (A)  Final   Report Status 01/07/2021 FINAL  Final  MRSA PCR Screening     Status: None   Collection Time: 01/05/21  2:21 AM   Specimen: Nasopharyngeal  Result Value Ref Range Status   MRSA by PCR NEGATIVE NEGATIVE Final    Comment:        The GeneXpert MRSA Assay (FDA approved for NASAL specimens only), is one component of a comprehensive MRSA colonization surveillance program. It is not intended to diagnose MRSA infection nor to guide or monitor treatment for MRSA infections. Performed at [facility], [place] [address], [place], [place] [address]   Culture, blood (routine x 2)     Status: None (Preliminary result)   Collection Time: 01/06/21 11:33 AM   Specimen: BLOOD RIGHT HAND  Result Value Ref  Range Status   Specimen Description BLOOD RIGHT HAND  Final   Special Requests   Final    BOTTLES DRAWN AEROBIC ONLY Blood Culture results may not be optimal due to an inadequate volume of blood received in culture bottles   Culture   Final    NO GROWTH 2 DAYS Performed at [facility], [place] [address] [place], [place] [address]    Report Status PENDING  Incomplete  Culture, blood (routine x 2)     Status: None (Preliminary result)   Collection Time: 01/06/21 11:37 AM   Specimen: BLOOD LEFT HAND  Result Value Ref Range Status   Specimen Description BLOOD LEFT HAND  Final   Special Requests   Final    BOTTLES DRAWN AEROBIC ONLY Blood Culture adequate volume   Culture   Final    NO GROWTH 2 DAYS Performed at [facility], [place] [address] [place], [place] [address]    Report Status PENDING  Incomplete     Terri Piedra, [place] [facility] [place] [facility]  01/08/2021  1:01 PM

## 2021-01-08 NOTE — Care Management Important Message (Signed)
Important Message  Patient Details  Name: Marc Rubio MRN: [id number] Date of Birth: 12-04-1945   Medicare Important Message Given:  Yes     Mahogani Holohan 01/08/2021, 3:12 PM

## 2021-01-08 NOTE — Progress Notes (Signed)
Occupational Therapy Treatment Patient Details Name: Marc Rubio MRN: [id number] DOB: 1946/04/23 Today's Date: 01/08/2021    History of present illness 75 yo male with onset of L side back pain was admitted, has concern for intra abd infection with sepsis, noted aortic infection, ileus. Lt wrist gout vs septic arthritis PMHx:  abd aortic injury, endovasc stent graft repair, SCC of tonsil, dysphagia, PEG tube, chronic ileus, DM, L wrist gout, CAD, pulm fibrosis   OT comments  Pt seen for L wrist splint need and prefabricated wrist cock-up splint appropriate. Ortho tech called to deliver splint. Pt education on need for movement of LUE and HEP provided  (along with shoulder exercises). Pt education for need for LUE elevation at wrist/hand, but pt reporting that he is more comfortable with it down. Pt would benefit from continued OT skilled services. OT following acutely.   Follow Up Recommendations  SNF;Supervision - Intermittent    Equipment Recommendations  3 in 1 bedside commode    Recommendations for Other Services      Precautions / Restrictions Precautions Precautions: Fall Precaution Comments: monitor pain in back Restrictions Weight Bearing Restrictions: No Other Position/Activity Restrictions: L wrist pain       Mobility Bed Mobility               General bed mobility comments: n/a    Transfers                 General transfer comment: n/a    Balance                                           ADL either performed or assessed with clinical judgement   ADL Overall ADL's : Needs assistance/impaired                                     Functional mobility during ADLs: Minimal assistance;Rolling walker;Cueing for safety;+2 for physical assistance General ADL Comments: Pt seen for L wrist splint need and prefabricated wrist cock-up splint appropriate. Ortho tech called to deliver splint. Pt education on need for movement  of LUE and HEP provided  (along with shoulder exercises)     Vision   Vision Assessment?: No apparent visual deficits Additional Comments: wears glasses   Perception     Praxis      Cognition Arousal/Alertness: Awake/alert Behavior During Therapy: WFL for tasks assessed/performed Overall Cognitive Status: Difficult to assess                                 General Comments: A/Ox4; family in room and they appeared to see pt as typical. Pt pleasant and cooperative about wrist splint        Exercises Exercises: General Upper Extremity General Exercises - Upper Extremity Shoulder Flexion: AROM;Both;10 reps;Supine Elbow Flexion: AROM;Both;10 reps;Supine Elbow Extension: AROM;Both;Supine Wrist Flexion: PROM;Both;10 reps;Supine Wrist Extension: PROM;Both;10 reps;Supine Digit Composite Flexion: AAROM;Both;10 reps;Supine Other Exercises Other Exercises: shoulder rolls   Shoulder Instructions       General Comments Pt's daughter in room    Pertinent Vitals/ Pain       Pain Assessment: Faces Faces Pain Scale: Hurts little more Pain Location: left wrist Pain Descriptors / Indicators: Guarding;Discomfort Pain Intervention(s): Monitored during session;Limited  activity within patient's tolerance;Premedicated before session;Repositioned  Home Living                                          Prior Functioning/Environment              Frequency  Min 2X/week        Progress Toward Goals  OT Goals(current goals can now be found in the care plan section)  Progress towards OT goals: Progressing toward goals  Acute Rehab OT Goals Patient Stated Goal: to go home OT Goal Formulation: With patient Time For Goal Achievement: 01/20/21 Potential to Achieve Goals: Good ADL Goals Pt Will Perform Grooming: with min guard assist;standing Pt Will Perform Upper Body Dressing: with modified independence;sitting Pt Will Perform Lower Body Dressing:  with supervision;sit to/from stand Pt Will Transfer to Toilet: with min guard assist;ambulating;bedside commode Additional ADL Goal #1: Pt will increase to supervisionA for ADL functional mobility and transfers to increase independence.  Plan Discharge plan remains appropriate    Co-evaluation                 AM-PAC OT "6 Clicks" Daily Activity     Outcome Measure   Help from another person eating meals?: None Help from another person taking care of personal grooming?: A Little Help from another person toileting, which includes using toliet, bedpan, or urinal?: A Lot Help from another person bathing (including washing, rinsing, drying)?: A Lot Help from another person to put on and taking off regular upper body clothing?: A Little Help from another person to put on and taking off regular lower body clothing?: A Lot 6 Click Score: 16    End of Session    OT Visit Diagnosis: Unsteadiness on feet (R26.81);Muscle weakness (generalized) (M62.81);Pain Pain - Right/Left: Left Pain - part of body: Hand   Activity Tolerance Patient tolerated treatment well;Patient limited by pain   Patient Left in bed;with call bell/phone within reach;with bed alarm set;with family/visitor present   Nurse Communication Mobility status        Time: 5027-7412 OT Time Calculation (min): 15 min  Charges: OT General Charges $OT Visit: 1 Visit OT Treatments $Therapeutic Exercise: 8-22 mins  Jefferey Pica, OTR/L [place] Pager: [phone] Office: [place] [facility] 01/08/2021, 4:37 PM

## 2021-01-08 NOTE — Progress Notes (Signed)
Orthopedic Tech Progress Note Patient Details:  Marc Rubio October 25, 1945 [id number]  Ortho Devices Type of Ortho Device: Velcro wrist splint Ortho Device/Splint Location: LUE Ortho Device/Splint Interventions: Ordered,Application,Adjustment   Post Interventions Patient Tolerated: Well Instructions Provided: Care of [place] 01/08/2021, 3:25 PM

## 2021-01-08 NOTE — Progress Notes (Signed)
Verbal order received from Dr. Wyline Copas to give via tube.

## 2021-01-08 NOTE — Progress Notes (Signed)
Pt refusing all blood sugar checks tonight. Pt educated on importance and still refusing.

## 2021-01-08 NOTE — Progress Notes (Signed)
Occupational Therapy Treatment Patient Details Name: Marc Rubio MRN: [id number] DOB: [birth year] Today's Date: 01/08/2021    History of present illness 75 yo male with onset of L side back pain was admitted, has concern for intra abd infection with sepsis, noted aortic infection, ileus. Lt wrist gout vs septic arthritis PMHx:  abd aortic injury, endovasc stent graft repair, SCC of tonsil, dysphagia, PEG tube, chronic ileus, DM, L wrist gout, CAD, pulm fibrosis   OT comments  Pt education for wear schedule and to notify RN if L wrist is more painful, red or notice increased swelling. Pt denied elevation in LUE at this time. Pt's handouts hung up in room for splint and HEP. Pt would benefit from continued OT skilled services. OT following acutely.   Follow Up Recommendations  SNF;Supervision - Intermittent    Equipment Recommendations  3 in 1 bedside commode    Recommendations for Other Services      Precautions / Restrictions Precautions Precautions: Fall Precaution Comments: monitor pain in back Restrictions Weight Bearing Restrictions: No Other Position/Activity Restrictions: L wrist pain       Mobility Bed Mobility               General bed mobility comments: n/a    Transfers                 General transfer comment: n/a    Balance                                           ADL either performed or assessed with clinical judgement   ADL Overall ADL's : Needs assistance/impaired                                     Functional mobility during ADLs: Minimal assistance;Rolling walker;Cueing for safety;+2 for physical assistance General ADL Comments: Pt seen for L wrist splint check for wear and tear adter wearing for 2 hours. Pt education on need for movement of LUE and HEP provided (along with shoulder exercises)     Vision   Vision Assessment?: No apparent visual deficits Additional Comments: wears glasses    Perception     Praxis      Cognition Arousal/Alertness: Awake/alert Behavior During Therapy: WFL for tasks assessed/performed Overall Cognitive Status: Difficult to assess                                 General Comments: A/Ox4; family in room and they appeared to see pt as typical. Pt pleasant and cooperative about wrist splint        Exercises Exercises: General Upper Extremity General Exercises - Upper Extremity Shoulder Flexion: AROM;Both;10 reps;Supine Elbow Flexion: AROM;Both;10 reps;Supine Elbow Extension: AROM;Both;Supine Wrist Flexion: PROM;Both;10 reps;Supine Wrist Extension: PROM;Both;10 reps;Supine Digit Composite Flexion: AAROM;Both;10 reps;Supine Other Exercises Other Exercises: shoulder rolls   Shoulder Instructions       General Comments BUE HEP and splint wearing schedule with picture of L wrist splint hung in room with education. pt with no redness, swelling nor areas of concern from wearing splint x2 hours. OT to check next available treatment day for splint check.    Pertinent Vitals/ Pain       Pain Assessment: Faces Faces Pain Scale: Hurts  little more Pain Location: left wrist Pain Descriptors / Indicators: Guarding;Discomfort Pain Intervention(s): Monitored during session  Home Living                                          Prior Functioning/Environment              Frequency  Min 2X/week        Progress Toward Goals  OT Goals(current goals can now be found in the care plan section)  Progress towards OT goals: Progressing toward goals  Acute Rehab OT Goals Patient Stated Goal: to go home OT Goal Formulation: With patient Time For Goal Achievement: 01/20/21 Potential to Achieve Goals: Good ADL Goals Pt Will Perform Grooming: with min guard assist;standing Pt Will Perform Upper Body Dressing: with modified independence;sitting Pt Will Perform Lower Body Dressing: with supervision;sit to/from  stand Pt Will Transfer to Toilet: with min guard assist;ambulating;bedside commode Additional ADL Goal #1: Pt will increase to supervisionA for ADL functional mobility and transfers to increase independence.  Plan Discharge plan remains appropriate    Co-evaluation                 AM-PAC OT "6 Clicks" Daily Activity     Outcome Measure   Help from another person eating meals?: None Help from another person taking care of personal grooming?: A Little Help from another person toileting, which includes using toliet, bedpan, or urinal?: A Lot Help from another person bathing (including washing, rinsing, drying)?: A Lot Help from another person to put on and taking off regular upper body clothing?: A Little Help from another person to put on and taking off regular lower body clothing?: A Lot 6 Click Score: 16    End of Session    OT Visit Diagnosis: Unsteadiness on feet (R26.81);Muscle weakness (generalized) (M62.81);Pain Pain - Right/Left: Left Pain - part of body: Hand   Activity Tolerance Patient tolerated treatment well;Patient limited by pain   Patient Left in bed;with call bell/phone within reach;with bed alarm set;with family/visitor present   Nurse Communication Mobility status        Time: 1550-1602 OT Time Calculation (min): 12 min  Charges: OT General Charges $OT Visit: 1 Visit OT Treatments $Therapeutic Activity: 8-22 mins $Therapeutic Exercise: 8-22 mins  Jefferey Pica, OTR/L Acute Rehabilitation Services Pager: [phone] Office: [place] [facility] 01/08/2021, 4:49 PM

## 2021-01-09 DIAGNOSIS — R7881 Bacteremia: Secondary | ICD-10-CM | POA: Diagnosis not present

## 2021-01-09 DIAGNOSIS — A419 Sepsis, unspecified organism: Secondary | ICD-10-CM | POA: Diagnosis not present

## 2021-01-09 LAB — GLUCOSE, CAPILLARY
Glucose-Capillary: 147 mg/dL — ABNORMAL HIGH (ref 70–99)
Glucose-Capillary: 144 mg/dL — ABNORMAL HIGH (ref 70–99)

## 2021-01-09 MED ORDER — SODIUM CHLORIDE 0.9 % IV SOLN: INTRAVENOUS | Status: DC

## 2021-01-09 NOTE — Progress Notes (Signed)
PT Cancellation Note  Patient Details Name: Marc Rubio MRN: [id number] DOB: [birth year]   Cancelled Treatment:    Reason Eval/Treat Not Completed: Patient declined, no reason specified  Patient adamant he was not going to work with PT. Explained rationale to keep his strength up so he'll be safe to go home when medically cleared and pt states, "I'll be fine" and continues to refuse to participate.   Arby Barrette, PT Pager [phone]   Rexanne Mano 01/09/2021, 3:51 PM

## 2021-01-09 NOTE — Progress Notes (Signed)
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 ATRIUM          Index LA diam:        3.40 cm 1.69 cm/m  RA Area:     9.05 cm LA Vol (A2C):   51.0 ml 25.34 ml/m RA Volume:   15.10 ml 7.50 ml/m LA Vol (A4C):   39.9 ml 19.82 ml/m LA Biplane Vol: 46.1 ml 22.90 ml/m  AORTIC VALVE AV Area (Vmax):    2.70 cm AV Area (Vmean):   2.60 cm AV Area (VTI):     2.51 cm AV Vmax:           121.00 cm/s AV Vmean:          76.500 cm/s AV VTI:            0.223 m AV Peak Grad:      5.9 mmHg AV Mean Grad:      3.0 mmHg LVOT Vmax:         104.00 cm/s LVOT Vmean:        63.300 cm/s LVOT VTI:          0.178 m LVOT/AV VTI ratio: 0.80  AORTA Ao Root diam: 3.40 cm Ao Asc diam:  3.30 cm MITRAL VALVE MV Area (PHT): 3.99 cm      SHUNTS MV Decel Time: 190 msec     Systemic VTI:  0.18 m MV E velocity: 53.30 cm/s   Systemic Diam: 2.00 cm MV A velocity: 104.00 cm/s MV E/A ratio:  0.51 Candee Furbish MD Electronically signed by Candee Furbish MD Signature Date/Time: 01/07/2021/3:43:01 PM    Final     Scheduled Meds: . allopurinol  300 mg Oral Daily  . colchicine  0.6 mg Oral Daily  . enoxaparin (LOVENOX) injection  40 mg Subcutaneous Q24H  . feeding supplement (OSMOLITE 1.5 CAL)  474 mL Per Tube TID  . feeding supplement (PROSource TF)  45 mL Per Tube Daily  . ferrous sulfate  220 mg Per Tube Daily  . insulin aspart  0-9 Units Subcutaneous Q4H  . pantoprazole sodium  40 mg Oral Daily   Continuous Infusions: . sodium chloride Stopped (01/04/21 2347)  . lactated ringers 100 mL/hr at 01/05/21 0224  . penicillin g continuous IV infusion 12 Million Units (01/09/21 1357)     LOS: 4 days   Marylu Lund, MD [facility] Pager On Amion  If 7PM-7AM, please contact night-coverage 01/09/2021, 2:17 PM

## 2021-01-09 NOTE — Progress Notes (Signed)
Occupational Therapy Treatment Patient Details Name: Marc Rubio MRN: [id number] DOB: [birth year] Today's Date: 01/09/2021    History of present illness 75 yo male with onset of L side back pain was admitted, has concern for intra abd infection with sepsis, noted aortic infection, ileus. Lt wrist gout vs septic arthritis PMHx:  abd aortic injury, endovasc stent graft repair, cancer of tonsil, dysphagia, PEG tube, chronic ileus, DM, L wrist gout, CAD, pulm fibrosis   OT comments  Pt continues to use L wrist cock up splint intermittently. No skin issues. Removed splint and performed AROM of L forearm, wrist and hand within pain tolerance. Pt sat EOB for grooming activities. Pt cooperative, but guarded. Educated in benefits of OOB activity, but pt refused.   Follow Up Recommendations  SNF;Supervision - Intermittent    Equipment Recommendations  3 in 1 bedside commode    Recommendations for Other Services      Precautions / Restrictions Precautions Precautions: Fall       Mobility Bed Mobility Overal bed mobility: Needs Assistance Bed Mobility: Supine to Sit;Sit to Supine     Supine to sit: Min guard Sit to supine: Min guard        Transfers                 General transfer comment: declined    Balance Overall balance assessment: Needs assistance   Sitting balance-Leahy Scale: Fair                                     ADL either performed or assessed with clinical judgement   ADL Overall ADL's : Needs assistance/impaired     Grooming: Set up;Sitting;Wash/dry hands;Brushing hair;Wash/dry face                                       Vision       Perception     Praxis      Cognition Arousal/Alertness: Awake/alert Behavior During Therapy: Flat affect                                   General Comments: pt oriented and aware he has an impending TEE, educated in importance of OOB activity, but pt continues to  decline        Exercises     Shoulder Instructions       General Comments      Pertinent Vitals/ Pain       Pain Assessment: Faces Faces Pain Scale: Hurts little more Pain Location: left wrist Pain Descriptors / Indicators: Guarding;Discomfort Pain Intervention(s): Monitored during session;Repositioned  Home Living                                          Prior Functioning/Environment              Frequency  Min 2X/week        Progress Toward Goals  OT Goals(current goals can now be found in the care plan section)  Progress towards OT goals: Progressing toward goals  Acute Rehab OT Goals Patient Stated Goal: to go home OT Goal Formulation: With patient Time For Goal Achievement: 01/20/21  Potential to Achieve Goals: Good  Plan Discharge plan remains appropriate    Co-evaluation                 AM-PAC OT "6 Clicks" Daily Activity     Outcome Measure   Help from another person eating meals?: None Help from another person taking care of personal grooming?: A Little Help from another person toileting, which includes using toliet, bedpan, or urinal?: A Lot Help from another person bathing (including washing, rinsing, drying)?: A Lot Help from another person to put on and taking off regular upper body clothing?: A Little Help from another person to put on and taking off regular lower body clothing?: A Lot 6 Click Score: 16    End of Session    OT Visit Diagnosis: Unsteadiness on feet (R26.81);Muscle weakness (generalized) (M62.81);Pain   Activity Tolerance Patient tolerated treatment well   Patient Left in bed;with call bell/phone within reach   Nurse Communication          Time: 3557-3220 OT Time Calculation (min): 18 min  Charges: OT General Charges $OT Visit: 1 Visit OT Treatments $Self Care/Home Management : 8-22 mins  Nestor Lewandowsky, OTR/L Acute Rehabilitation Services Pager: [phone] Office:  [phone]   Malka So 01/09/2021, 12:59 PM

## 2021-01-09 NOTE — H&P (View-Only) (Signed)
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 ATRIUM          Index LA diam:        3.40 cm 1.69 cm/m  RA Area:     9.05 cm LA Vol (A2C):   51.0 ml 25.34 ml/m RA Volume:   15.10 ml 7.50 ml/m LA Vol (A4C):   39.9 ml 19.82 ml/m LA Biplane Vol: 46.1 ml 22.90 ml/m  AORTIC VALVE AV Area (Vmax):    2.70 cm AV Area (Vmean):   2.60 cm AV Area (VTI):     2.51 cm AV Vmax:           121.00 cm/s AV Vmean:          76.500 cm/s AV VTI:            0.223 m AV Peak Grad:      5.9 mmHg AV Mean Grad:      3.0 mmHg LVOT Vmax:         104.00 cm/s LVOT Vmean:        63.300 cm/s LVOT VTI:          0.178 m LVOT/AV VTI ratio: 0.80  AORTA Ao Root diam: 3.40 cm Ao Asc diam:  3.30 cm MITRAL VALVE MV Area (PHT): 3.99 cm      SHUNTS MV Decel Time: 190 msec     Systemic VTI:  0.18 m MV E velocity: 53.30 cm/s   Systemic Diam: 2.00 cm MV A velocity: 104.00 cm/s MV E/A ratio:  0.51 Candee Furbish MD Electronically signed by Candee Furbish MD Signature Date/Time: 01/07/2021/3:43:01 PM    Final     Scheduled Meds: . allopurinol  300 mg Oral Daily  . colchicine  0.6 mg Oral Daily  . enoxaparin (LOVENOX) injection  40 mg Subcutaneous Q24H  . feeding supplement (OSMOLITE 1.5 CAL)  474 mL Per Tube TID  . feeding supplement (PROSource TF)  45 mL Per Tube Daily  . ferrous sulfate  220 mg Per Tube Daily  . insulin aspart  0-9 Units Subcutaneous Q4H  . pantoprazole sodium  40 mg Oral Daily   Continuous Infusions: . sodium chloride Stopped (01/04/21 2347)  . lactated ringers 100 mL/hr at 01/05/21 0224  . penicillin g continuous IV infusion 12 Million Units (01/09/21 1357)     LOS: 4 days   Marylu Lund, MD [facility] Pager On Amion  If 7PM-7AM, please contact night-coverage 01/09/2021, 2:17 PM

## 2021-01-09 NOTE — Progress Notes (Signed)
Received call from lab that patient refused lab draw this morning.

## 2021-01-09 NOTE — Progress Notes (Signed)
Patient refusing CBG checks.  

## 2021-01-09 NOTE — Progress Notes (Signed)
    [facility] has been requested to perform a transesophageal echocardiogram on Marc Rubio for bacteremia.  After careful review of history and examination, the risks and benefits of transesophageal echocardiogram have been explained including risks of esophageal damage, perforation (1:10,000 risk), bleeding, pharyngeal hematoma as well as other potential complications associated with conscious sedation including aspiration, arrhythmia, respiratory failure and death. Alternatives to treatment were discussed, questions were answered. Patient is willing to proceed.  TEE - Dr.Ross 01/10/21 @ 1200. NPO after midnight. Meds with sips.   Leanor Kail, PA-C 01/09/2021 1:24 PM

## 2021-01-10 ENCOUNTER — Inpatient Hospital Stay: Payer: Self-pay

## 2021-01-10 ENCOUNTER — Inpatient Hospital Stay (HOSPITAL_COMMUNITY): Payer: Medicare Other

## 2021-01-10 ENCOUNTER — Inpatient Hospital Stay (HOSPITAL_COMMUNITY): Payer: Medicare Other | Admitting: Certified Registered Nurse Anesthetist

## 2021-01-10 ENCOUNTER — Encounter (HOSPITAL_COMMUNITY): Payer: Self-pay

## 2021-01-10 ENCOUNTER — Encounter (HOSPITAL_COMMUNITY)
Admission: EM | Disposition: A | Discharge status: Skilled Nursing Facility | Payer: Self-pay | Source: Home / Self Care | Attending: Internal Medicine

## 2021-01-10 DIAGNOSIS — A419 Sepsis, unspecified organism: Secondary | ICD-10-CM | POA: Diagnosis not present

## 2021-01-10 DIAGNOSIS — R7881 Bacteremia: Secondary | ICD-10-CM | POA: Diagnosis not present

## 2021-01-10 HISTORY — PX: TEE WITHOUT CARDIOVERSION: SHX5443

## 2021-01-10 LAB — COMPREHENSIVE METABOLIC PANEL
ALT: 15 U/L (ref 0–44)
AST: 24 U/L (ref 15–41)
Albumin: 2.4 g/dL — ABNORMAL LOW (ref 3.5–5.0)
Alkaline Phosphatase: 76 U/L (ref 38–126)
Anion gap: 7 (ref 5–15)
BUN: 15 mg/dL (ref 8–23)
CO2: 27 mmol/L (ref 22–32)
Calcium: 9.1 mg/dL (ref 8.9–10.3)
Chloride: 98 mmol/L (ref 98–111)
Creatinine, Ser: 0.71 mg/dL (ref 0.61–1.24)
GFR, Estimated: >60 mL/min (ref >60)
Glucose, Bld: 121 mg/dL — ABNORMAL HIGH (ref 70–99)
Potassium: 4.5 mmol/L (ref 3.5–5.1)
Sodium: 132 mmol/L — ABNORMAL LOW (ref 135–145)
Total Bilirubin: 0.1 mg/dL — ABNORMAL LOW (ref 0.3–1.2)
Total Protein: 7 g/dL (ref 6.5–8.1)

## 2021-01-10 LAB — GLUCOSE, CAPILLARY
Glucose-Capillary: 126 mg/dL — ABNORMAL HIGH (ref 70–99)
Glucose-Capillary: 94 mg/dL (ref 70–99)

## 2021-01-10 LAB — CBC
HCT: 36.3 % — ABNORMAL LOW (ref 39.0–52.0)
Hemoglobin: 11.7 g/dL — ABNORMAL LOW (ref 13.0–17.0)
MCH: 30.3 pg (ref 26.0–34.0)
MCHC: 32.2 g/dL (ref 30.0–36.0)
MCV: 94 fL (ref 80.0–100.0)
Platelets: 430 10*3/uL — ABNORMAL HIGH (ref 150–400)
RBC: 3.86 MIL/uL — ABNORMAL LOW (ref 4.22–5.81)
RDW: 14.5 % (ref 11.5–15.5)
WBC: 8.6 10*3/uL (ref 4.0–10.5)
nRBC: 0 % (ref 0.0–0.2)

## 2021-01-10 SURGERY — ECHOCARDIOGRAM, TRANSESOPHAGEAL
Anesthesia: Monitor Anesthesia Care

## 2021-01-10 MED ORDER — FERROUS SULFATE 300 (60 FE) MG/5ML PO SYRP
300.0000 mg | ORAL_SOLUTION | Freq: Every day | ORAL | Status: DC
  Administered 2021-01-10 – 2021-01-23 (×14): 300 mg
  Filled 2021-01-10 (×15): qty 5

## 2021-01-10 MED ORDER — LIDOCAINE 2% (20 MG/ML) 5 ML SYRINGE
INTRAMUSCULAR | Status: DC | PRN
  Administered 2021-01-10: 80 mg via INTRAVENOUS

## 2021-01-10 MED ORDER — SODIUM CHLORIDE 0.9 % IV SOLN: INTRAVENOUS | Status: DC | PRN

## 2021-01-10 MED ORDER — PROPOFOL 500 MG/50ML IV EMUL
INTRAVENOUS | Status: DC | PRN
  Administered 2021-01-10: 100 ug/kg/min via INTRAVENOUS

## 2021-01-10 NOTE — CV Procedure (Signed)
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      Will notify primary service.  Dorris Carnes MD

## 2021-01-10 NOTE — Progress Notes (Signed)
Patient refused this morning's lab work

## 2021-01-10 NOTE — Interval H&P Note (Signed)
History and Physical Interval Note:  01/10/2021 11:53 AM  Marc Rubio  has presented today for surgery, with the diagnosis of BACTEREMIA.  The various methods of treatment have been discussed with the patient and family. After consideration of risks, benefits and other options for treatment, the patient has consented to  Procedure(s): TRANSESOPHAGEAL ECHOCARDIOGRAM (TEE) (N/A) as a surgical intervention.  The patient's history has been reviewed, patient examined, no change in status, stable for surgery.  I have reviewed the patient's chart and labs.  Questions were answered to the patient's satisfaction.     Dorris Carnes

## 2021-01-10 NOTE — TOC Progression Note (Addendum)
Transition of Care [facility]) - Progression Note    Patient Details  Name: Minoru Chap MRN: [id number] Date of Birth: 1946/05/09  [facility]) CM/SW Contact  [name], LCSW Phone Number: 01/10/2021, 1:44 PM  Clinical Narrative:  Pt now requiring IV abx.  CSW spoke with ex wife [name] on the phone and she does not think it will work to try home infusion for several reasons. She is asking pt to go to SNF.  CSW and MD spoke with pt in room regarding IV abx, pt more agreeable to potential SNF admission.  CSW then spoke with [name] and agreed for CSW to pursue SNF placement.     1400: PASSR obtained: [id number].  Choice document given to pt and referral sent out in hub.  Pt first choice would be facility closer to [facility].    1530: Candace Cruise submitted     Expected Discharge Plan: [place] Barriers to Discharge: Continued Medical Work up  Expected Discharge Plan and Services Expected Discharge Plan: [place] Choice: [place] arrangements for the past 2 months: [place]: PT,OT [place]: [place] Date [place]: 01/07/21 Time [place]: 6283 Representative spoke with at [place]: [place] ([place]) Interventions    Readmission Risk Interventions No flowsheet data found.

## 2021-01-10 NOTE — Progress Notes (Signed)
   01/10/21 1300  PT Recommendation  Follow Up Recommendations SNF  Original recommendation by therapist was SNF but pt refused.  Pt is now agreeable to SNF. Updated d/c plan. Elly Haffey M,PT Acute Rehab Services [phone] (pager)
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
## 2021-01-10 NOTE — NC FL2 (Signed)
[place] MEDICAID FL2 LEVEL OF CARE SCREENING TOOL     IDENTIFICATION  Patient Name: Marc Rubio Birthdate: [birth year] Sex: male Admission Date (Current Location): 01/04/2021  [place] and [place] Number:  Kathleen Argue [id number] [place] and Address:  [facility] [place], [place] [address] [place], [place] [address]      Provider Number: [id number]  Attending Physician Name and Address:  Donne Hazel, MD  Relative Name and Phone Number:  Alisa Graff   [phone]    Current Level of Care: Hospital Recommended Level of Care: [place] Prior Approval Number:    Date Approved/Denied:   PASRR Number: [id number]  Discharge Plan: SNF    Current Diagnoses: Patient Active Problem List   Diagnosis Date Noted  . Abnormal finding on GI tract imaging   . History of colonic polyps   . Bacteremia   . Septic arthritis ([place])   . Intra-abdominal infection 01/05/2021  . Ileus ([place]) 01/05/2021  . DM2 (diabetes mellitus, type 2) ([place]) 01/05/2021  . Sepsis due to undetermined organism ([place]) 01/04/2021    Orientation RESPIRATION BLADDER Height & Weight     Self,Time,Situation,Place  Normal Continent Weight: 175 lb (79.4 kg) Height:  6' (182.9 cm)  BEHAVIORAL SYMPTOMS/MOOD NEUROLOGICAL BOWEL NUTRITION STATUS      Continent Feeding tube  AMBULATORY STATUS COMMUNICATION OF NEEDS Skin   Limited Assist Verbally Normal                       Personal Care Assistance Level of Assistance  Bathing,Feeding,Dressing Bathing Assistance: Limited assistance Feeding assistance: Independent Dressing Assistance: Limited assistance     Functional Limitations Info  Sight,Hearing,Speech Sight Info: Adequate Hearing Info: Adequate Speech Info: Adequate    SPECIAL CARE FACTORS FREQUENCY  PT (By licensed PT),OT (By licensed OT)     PT Frequency: 5x week OT Frequency: 5x week            Contractures Contractures Info: Not present     Additional Factors Info  Code Status,Allergies,Insulin Sliding Scale Code Status Info: full Allergies Info: NKA   Insulin Sliding Scale Info: Novolog, 0-9 units q 4 hours.  See discharge summary       Current Medications (01/10/2021):  This is the current hospital active medication list Current Facility-Administered Medications  Medication Dose Route Frequency Provider Last Rate Last Admin  . 0.9 %  sodium chloride infusion   Intravenous PRN Breck Coons, MD   Stopped at 01/04/21 2347  . acetaminophen (TYLENOL) tablet 650 mg  650 mg Oral Q6H PRN Etta Quill, DO   650 mg at 01/07/21 1115   Or  . acetaminophen (TYLENOL) suppository 650 mg  650 mg Rectal Q6H PRN Etta Quill, DO      . allopurinol (ZYLOPRIM) tablet 300 mg  300 mg Oral Daily Nita Sells, MD   300 mg at 01/09/21 0840  . bisacodyl (DULCOLAX) EC tablet 5 mg  5 mg Oral Daily PRN Mansy, Jan A, MD   5 mg at 01/06/21 0143  . colchicine tablet 0.6 mg  0.6 mg Oral Daily Nita Sells, MD   0.6 mg at 01/09/21 0839  . enoxaparin (LOVENOX) injection 40 mg  40 mg Subcutaneous Q24H Jennette Kettle M, DO   40 mg at 01/08/21 1550  . feeding supplement (OSMOLITE 1.5 CAL) liquid 474 mL  474 mL Per Tube TID Nita Sells, MD   474 mL at 01/09/21 2147  . feeding supplement (PROSource  TF) liquid 45 mL  45 mL Per Tube Daily Nita Sells, MD   45 mL at 01/09/21 0839  . ferrous sulfate 300 (60 Fe) MG/5ML syrup 300 mg  300 mg Per Tube Q breakfast Donne Hazel, MD      . insulin aspart (novoLOG) injection 0-9 Units  0-9 Units Subcutaneous Q4H Etta Quill, DO   1 Units at 01/10/21 0827  . lactated ringers infusion   Intravenous Continuous Etta Quill, DO 100 mL/hr at 01/05/21 0224 New Bag at 01/05/21 0224  . ondansetron (ZOFRAN) tablet 4 mg  4 mg Oral Q6H PRN Etta Quill, DO       Or  . ondansetron [facility]) injection 4 mg  4 mg Intravenous Q6H PRN Etta Quill, DO      . pantoprazole  sodium (PROTONIX) 40 mg/20 mL oral suspension 40 mg  40 mg Oral Daily Pierce, Dwayne A, RPH   40 mg at 01/09/21 0840  . penicillin G potassium 12 Million Units in dextrose 5 % 500 mL continuous infusion  12 Million Units Intravenous Q12H Vu, Trung T, MD 41.7 mL/hr at 01/10/21 0210 12 Million Units at 01/10/21 0210  . traMADol (ULTRAM) tablet 50 mg  50 mg Oral Q6H PRN Nita Sells, MD   50 mg at 01/10/21 3704     Discharge Medications: Please see discharge summary for a list of discharge medications.  Relevant Imaging Results:  Relevant Lab Results:   Additional Information SSN [id number]. Patient is not vaccinated for covid.  [name], LCSW

## 2021-01-10 NOTE — Anesthesia Postprocedure Evaluation (Signed)
Anesthesia Post Note  Patient: Zayyan Mullen  Procedure(s) Performed: TRANSESOPHAGEAL ECHOCARDIOGRAM (TEE) (N/A )     Patient location during evaluation: PACU Anesthesia Type: MAC Level of consciousness: awake and alert Pain management: pain level controlled Vital Signs Assessment: post-procedure vital signs reviewed and stable Respiratory status: spontaneous breathing, nonlabored ventilation, respiratory function stable and patient connected to nasal cannula oxygen Cardiovascular status: stable and blood pressure returned to baseline Postop Assessment: no apparent nausea or vomiting Anesthetic complications: no   No complications documented.  Last Vitals:  Vitals:   01/10/21 1243 01/10/21 1318  BP: 139/62 136/81  Pulse: 79 85  Resp: 12 17  Temp:  36.7 C  SpO2: 98% 97%    Last Pain:  Vitals:   01/10/21 1318  TempSrc: Oral  PainSc:                  Merlinda Frederick

## 2021-01-10 NOTE — Transfer of Care (Signed)
Immediate Anesthesia Transfer of Care Note  Patient: Marc Rubio  Procedure(s) Performed: TRANSESOPHAGEAL ECHOCARDIOGRAM (TEE) (N/A )  Patient Location: Endoscopy Unit  Anesthesia Type:MAC  Level of Consciousness: awake, alert , patient cooperative and responds to stimulation  Airway & Oxygen Therapy: Patient Spontanous Breathing and Patient connected to nasal cannula oxygen  Post-op Assessment: Report given to RN and Post -op Vital signs reviewed and stable  Post vital signs: Reviewed and stable  Last Vitals:  Vitals Value Taken Time  BP    Temp    Pulse 81 01/10/21 1232  Resp 0 01/10/21 1232  SpO2 97 % 01/10/21 1232  Vitals shown include unvalidated device data.  Last Pain:  Vitals:   01/10/21 1127  TempSrc: Oral  PainSc: 8       Patients Stated Pain Goal: 1 (49/70/26 3785)  Complications: No complications documented.

## 2021-01-10 NOTE — Anesthesia Preprocedure Evaluation (Addendum)
Anesthesia Evaluation  Patient identified by MRN, date of birth, ID band Patient awake    Reviewed: Allergy & Precautions, NPO status , Patient's Chart, lab work & pertinent test results  Airway Mallampati: III  TM Distance: >3 FB Neck ROM: Full    Dental  (+) Poor Dentition   Pulmonary  Pulmonary fibrosis   breath sounds clear to auscultation + decreased breath sounds      Cardiovascular + CAD   Rhythm:Regular Rate:Tachycardia  EKG: Sinus tachycardia  01/07/21: ECHO  1. Left ventricular ejection fraction, by estimation, is 60 to 65%. The  left ventricle has normal function. The left ventricle has no regional  wall motion abnormalities. There is mild left ventricular hypertrophy.  Left ventricular diastolic parameters  are consistent with Grade I diastolic dysfunction (impaired relaxation).  2. Right ventricular systolic function is normal. The right ventricular  size is normal.  3. The mitral valve is normal in structure. Trivial mitral valve  regurgitation. No evidence of mitral stenosis.  4. The aortic valve is normal in structure. Aortic valve regurgitation is  trivial. No aortic stenosis is present.  5. The inferior vena cava is normal in size with greater than 50%  respiratory variability, suggesting right atrial pressure of 3 mmHg.    Neuro/Psych negative neurological ROS  negative psych ROS   GI/Hepatic Chronic constipation, no nausea/vomiting   Endo/Other    Renal/GU      Musculoskeletal  (+) Arthritis  (septic arthritis),   Abdominal   Peds  Hematology   Anesthesia Other Findings H/o tonsillar cancer   Reproductive/Obstetrics                            Anesthesia Physical Anesthesia Plan  ASA: III  Anesthesia Plan: MAC   Post-op Pain Management:    Induction: Intravenous  PONV Risk Score and Plan: Propofol infusion, Treatment may vary due to age or medical  condition and TIVA  Airway Management Planned: Natural Airway and Nasal Cannula  Additional Equipment: None  Intra-op Plan:   Post-operative Plan:   Informed Consent: I have reviewed the patients History and Physical, chart, labs and discussed the procedure including the risks, benefits and alternatives for the proposed anesthesia with the patient or authorized representative who has indicated his/her understanding and acceptance.       Plan Discussed with: Anesthesiologist and CRNA  Anesthesia Plan Comments:        Anesthesia Quick Evaluation

## 2021-01-10 NOTE — Progress Notes (Signed)
PT Cancellation Note  Patient Details Name: Marc Rubio MRN: [id number] DOB: 03-13-1946   Cancelled Treatment:    Reason Eval/Treat Not Completed: Patient at procedure or test/unavailable (Pt in Endo)   Alvira Philips 01/10/2021, 12:22 PM Deunte Bledsoe M,PT Acute Rehab Services [phone] (pager)

## 2021-01-10 NOTE — Progress Notes (Signed)
Marc Rubio for Infectious Disease  Date of Admission:  01/04/2021     Total days of antibiotics 7         ASSESSMENT:  Marc Rubio was unable to have TEE completed. Blood and synovial fluid cultures remain without growth to date. 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 Unclear if wrist is a source of infection or not, however 6 weeks of antibiotics will cover any underlying osteomyelitis. OPAT orders and Home Health placed.  PLAN:  1. Continue penicillin and transition to Penicillin G continuous 24 hour infusion at discharge.  2. Place PICC line.  3. Monitor cultures until finalized. 4. 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 Care Per Protocol:  Home health RN for IV administration and teaching; PICC line care and labs.    Labs weekly while on IV antibiotics: _X_ CBC with differential _X_ BMP __ CMP _X_ CRP _X_ ESR __ Vancomycin trough __ CK  _X_ Please pull PIC at completion of IV antibiotics __ Please leave PIC in place until doctor has seen patient or been notified  Fax weekly labs to [phone]  Clinic Follow Up Appt:  5/27 at 9:45 am  Principal Problem:   Sepsis due to undetermined organism [facility]) Active Problems:   Intra-abdominal infection   Ileus ([place])   DM2 (diabetes mellitus, type 2) ([place])   Bacteremia   Septic arthritis ([place])   Abnormal finding on GI tract imaging   History of colonic polyps  . allopurinol  300 mg Oral Daily  . colchicine  0.6 mg  Oral Daily  . enoxaparin (LOVENOX) injection  40 mg Subcutaneous Q24H  . feeding supplement (OSMOLITE 1.5 CAL)  474 mL Per Tube TID  . feeding supplement (PROSource TF)  45 mL Per Tube Daily  . ferrous sulfate  300 mg Per Tube Q breakfast  . insulin aspart  0-9 Units Subcutaneous Q4H  . pantoprazole sodium  40 mg Oral Daily    SUBJECTIVE:  Afebrile overnight with no acute events. Cultures remain without growth. Unable to complete TEE today. Denies fevers or chills.   No Known Allergies   Review of Systems: Review of Systems  Constitutional: Negative for chills, fever and weight loss.  Respiratory: Negative for cough, shortness of breath and wheezing.   Cardiovascular: Negative for chest pain and leg swelling.  Gastrointestinal: Negative for abdominal pain, constipation, diarrhea, nausea and vomiting.  Skin: Negative for rash.      OBJECTIVE: Vitals:   01/10/21 1127 01/10/21 1231 01/10/21 1243 01/10/21 1318  BP: 112/73 128/82 139/62 136/81  Pulse: 88 81 79 85  Resp: '19 19 12 17  ' Temp: 97.7 F (36.5 C) 97.6 F (36.4 C)  98 F (36.7 C)  TempSrc: Oral Oral  Oral  SpO2: 95% 99% 98% 97%  Weight: 79.4 kg     Height: 6' (1.829 m)      Body mass index is 23.73 kg/m.  Physical Exam Constitutional:      General: He is not in acute distress.    Appearance:  He is well-developed.  Cardiovascular:     Rate and Rhythm: Normal rate and regular rhythm.     Heart sounds: Normal heart sounds.  Pulmonary:     Effort: Pulmonary effort is normal.     Breath sounds: Normal breath sounds.  Skin:    General: Skin is warm and dry.  Neurological:     Mental Status: He is alert and oriented to person, place, and time.  Psychiatric:        Behavior: Behavior normal.        Thought Content: Thought content normal.        Judgment: Judgment normal.     Lab Results Lab Results  Component Value Date   WBC 8.6 01/10/2021   HGB 11.7 (L) 01/10/2021   HCT 36.3 (L) 01/10/2021   MCV  94.0 01/10/2021   PLT 430 (H) 01/10/2021    Lab Results  Component Value Date   CREATININE 0.71 01/10/2021   BUN 15 01/10/2021   NA 132 (L) 01/10/2021   K 4.5 01/10/2021   CL 98 01/10/2021   CO2 27 01/10/2021    Lab Results  Component Value Date   ALT 15 01/10/2021   AST 24 01/10/2021   ALKPHOS 76 01/10/2021   BILITOT 0.1 (L) 01/10/2021     Microbiology: Recent Results (from the past 240 hour(s))  Blood culture (routine single)     Status: Abnormal   Collection Time: 01/04/21  7:35 PM   Specimen: BLOOD LEFT FOREARM  Result Value Ref Range Status   Specimen Description BLOOD LEFT FOREARM BLOOD  Final   Special Requests   Final    BOTTLES DRAWN AEROBIC AND ANAEROBIC Blood Culture adequate volume   Culture  Setup Time   Final    GRAM POSITIVE COCCI IN CHAINS IN PAIRS IN BOTH AEROBIC AND ANAEROBIC BOTTLES CRITICAL RESULT CALLED TO, READ BACK BY AND VERIFIED WITH: CATHY PIERCE PHARMD '@1512'  01/05/21 EB    Culture STREPTOCOCCUS GROUP G (A)  Final   Report Status 01/07/2021 FINAL  Final   Organism ID, Bacteria STREPTOCOCCUS GROUP G  Final      Susceptibility   Streptococcus group g - MIC*    CLINDAMYCIN <=0.25 SENSITIVE Sensitive     AMPICILLIN <=0.25 SENSITIVE Sensitive     ERYTHROMYCIN <=0.12 SENSITIVE Sensitive     VANCOMYCIN 0.5 SENSITIVE Sensitive     CEFTRIAXONE <=0.12 SENSITIVE Sensitive     LEVOFLOXACIN 0.5 SENSITIVE Sensitive     PENICILLIN Value in next row Sensitive      SENSITIVE<=0.06Performed at [facility], [address], [place], [place] [address]    * STREPTOCOCCUS GROUP G  Blood Culture ID Panel (Reflexed)     Status: Abnormal   Collection Time: 01/04/21  7:35 PM  Result Value Ref Range Status   Enterococcus faecalis NOT DETECTED NOT DETECTED Final   Enterococcus Faecium NOT DETECTED NOT DETECTED Final   Listeria monocytogenes NOT DETECTED NOT DETECTED Final   Staphylococcus species NOT DETECTED NOT DETECTED Final   Staphylococcus aureus (BCID)  NOT DETECTED NOT DETECTED Final   Staphylococcus epidermidis NOT DETECTED NOT DETECTED Final   Staphylococcus lugdunensis NOT DETECTED NOT DETECTED Final   Streptococcus species DETECTED (A) NOT DETECTED Final    Comment: Not Enterococcus species, Streptococcus agalactiae, Streptococcus pyogenes, or Streptococcus pneumoniae. CRITICAL RESULT CALLED TO, READ BACK BY AND VERIFIED WITH: CATHY PIERCE PHARMD '@1512'  01/05/21 EB    Streptococcus agalactiae NOT DETECTED NOT DETECTED Final   Streptococcus pneumoniae  NOT DETECTED NOT DETECTED Final   Streptococcus pyogenes NOT DETECTED NOT DETECTED Final   A.calcoaceticus-baumannii NOT DETECTED NOT DETECTED Final   Bacteroides fragilis NOT DETECTED NOT DETECTED Final   Enterobacterales NOT DETECTED NOT DETECTED Final   Enterobacter cloacae complex NOT DETECTED NOT DETECTED Final   Escherichia coli NOT DETECTED NOT DETECTED Final   Klebsiella aerogenes NOT DETECTED NOT DETECTED Final   Klebsiella oxytoca NOT DETECTED NOT DETECTED Final   Klebsiella pneumoniae NOT DETECTED NOT DETECTED Final   Proteus species NOT DETECTED NOT DETECTED Final   Salmonella species NOT DETECTED NOT DETECTED Final   Serratia marcescens NOT DETECTED NOT DETECTED Final   Haemophilus influenzae NOT DETECTED NOT DETECTED Final   Neisseria meningitidis NOT DETECTED NOT DETECTED Final   Pseudomonas aeruginosa NOT DETECTED NOT DETECTED Final   Stenotrophomonas maltophilia NOT DETECTED NOT DETECTED Final   Candida albicans NOT DETECTED NOT DETECTED Final   Candida auris NOT DETECTED NOT DETECTED Final   Candida glabrata NOT DETECTED NOT DETECTED Final   Candida krusei NOT DETECTED NOT DETECTED Final   Candida parapsilosis NOT DETECTED NOT DETECTED Final   Candida tropicalis NOT DETECTED NOT DETECTED Final   Cryptococcus neoformans/gattii NOT DETECTED NOT DETECTED Final    Comment: Performed at [facility], [address] [place], [place] [address]  Culture, blood  (single)     Status: Abnormal   Collection Time: 01/04/21  9:00 PM   Specimen: BLOOD RIGHT HAND  Result Value Ref Range Status   Specimen Description   Final    BLOOD RIGHT HAND BLOOD Performed at [facility], [address] [place], [place] [address]    Special Requests   Final    BOTTLES DRAWN AEROBIC ONLY Blood Culture adequate volume Performed at [facility], [address] [place], [place] [address]    Culture  Setup Time GRAM POSITIVE COCCI AEROBIC BOTTLE ONLY   Final   Culture (A)  Final    STREPTOCOCCUS GROUP G SUSCEPTIBILITIES PERFORMED ON PREVIOUS CULTURE WITHIN THE LAST 5 DAYS. Performed at [facility], [place] [address], [place], [place] [address]    Report Status 01/07/2021 FINAL  Final  Resp Panel by RT-PCR (Flu A&B, Covid) Nasopharyngeal Swab     Status: None   Collection Time: 01/04/21  9:25 PM   Specimen: Nasopharyngeal Swab; Nasopharyngeal(NP) swabs in vial transport medium  Result Value Ref Range Status   SARS Coronavirus 2 by RT PCR NEGATIVE NEGATIVE Final    Comment: (NOTE) SARS-CoV-2 target nucleic acids are NOT DETECTED.  The SARS-CoV-2 RNA is generally detectable in upper respiratory specimens during the acute phase of infection. The lowest concentration of SARS-CoV-2 viral copies this assay can detect is 138 copies/mL. A negative result does not preclude SARS-Cov-2 infection and should not be used as the sole basis for treatment or other patient management decisions. A negative result may occur with  improper specimen collection/handling, submission of specimen other than nasopharyngeal swab, presence of viral mutation(s) within the areas targeted by this assay, and inadequate number of viral copies(<138 copies/mL). A negative result must be combined with clinical observations, patient history, and epidemiological information. The expected result is Negative.  Fact Sheet for Patients:   EntrepreneurPulse.com.au  Fact Sheet for Healthcare Providers:  IncredibleEmployment.be  This test is no t yet approved or cleared by the [place] FDA and  has been authorized for detection and/or diagnosis of SARS-CoV-2 by FDA under an Emergency  Use Authorization (EUA). This EUA will remain  in effect (meaning this test can be used) for the duration of the COVID-19 declaration under Section 564(b)(1) of the Act, 21 U.S.C.section 360bbb-3(b)(1), unless the authorization is terminated  or revoked sooner.       Influenza A by PCR NEGATIVE NEGATIVE Final   Influenza B by PCR NEGATIVE NEGATIVE Final    Comment: (NOTE) The Xpert Xpress SARS-CoV-2/FLU/RSV plus assay is intended as an aid in the diagnosis of influenza from Nasopharyngeal swab specimens and should not be used as a sole basis for treatment. Nasal washings and aspirates are unacceptable for Xpert Xpress SARS-CoV-2/FLU/RSV testing.  Fact Sheet for Patients: EntrepreneurPulse.com.au  Fact Sheet for Healthcare Providers: IncredibleEmployment.be  This test is not yet approved or cleared by the [place] FDA and has been authorized for detection and/or diagnosis of SARS-CoV-2 by FDA under an Emergency Use Authorization (EUA). This EUA will remain in effect (meaning this test can be used) for the duration of the COVID-19 declaration under Section 564(b)(1) of the Act, 21 U.S.C. section 360bbb-3(b)(1), unless the authorization is terminated or revoked.  Performed at [facility], [address] [place], [place] [address]   Urine culture     Status: Abnormal   Collection Time: 01/05/21 12:15 AM   Specimen: In/Out Cath Urine  Result Value Ref Range Status   Specimen Description   Final    IN/OUT CATH URINE Performed at [facility], [address] [place], [place] [address]    Special Requests   Final     NONE Performed at [facility], [address] [place], [place] [address]    Culture MULTIPLE SPECIES PRESENT, SUGGEST RECOLLECTION (A)  Final   Report Status 01/07/2021 FINAL  Final  MRSA PCR Screening     Status: None   Collection Time: 01/05/21  2:21 AM   Specimen: Nasopharyngeal  Result Value Ref Range Status   MRSA by PCR NEGATIVE NEGATIVE Final    Comment:        The GeneXpert MRSA Assay (FDA approved for NASAL specimens only), is one component of a comprehensive MRSA colonization surveillance program. It is not intended to diagnose MRSA infection nor to guide or monitor treatment for MRSA infections. Performed at [facility], [place] [address], [place], [place] [address]   Culture, blood (routine x 2)     Status: None (Preliminary result)   Collection Time: 01/06/21 11:33 AM   Specimen: BLOOD RIGHT HAND  Result Value Ref Range Status   Specimen Description BLOOD RIGHT HAND  Final   Special Requests   Final    BOTTLES DRAWN AEROBIC ONLY Blood Culture results may not be optimal due to an inadequate volume of blood received in culture bottles   Culture   Final    NO GROWTH 4 DAYS Performed at [facility], [place] [address], [place], [place] [address]    Report Status PENDING  Incomplete  Culture, blood (routine x 2)     Status: None (Preliminary result)   Collection Time: 01/06/21 11:37 AM   Specimen: BLOOD LEFT HAND  Result Value Ref Range Status   Specimen Description BLOOD LEFT HAND  Final   Special Requests   Final    BOTTLES DRAWN AEROBIC ONLY Blood Culture adequate volume   Culture   Final    NO GROWTH 4 DAYS Performed at [facility], [place] [address] [place], [place]  [address]    Report Status PENDING  Incomplete  Body fluid culture w Gram Stain     Status: None (Preliminary result)   Collection Time: 01/08/21  2:49 PM   Specimen: PATH Cytology Misc. fluid; Synovial Fluid  Result Value Ref Range Status   Specimen Description  SYNOVIAL FLUID  Final   Special Requests SYNOVIAL FLUID  Final   Gram Stain   Final    MODERATE WBC PRESENT,BOTH PMN AND MONONUCLEAR NO ORGANISMS SEEN    Culture   Final    NO GROWTH 2 DAYS Performed at [facility], [address] [place], [place] [address]    Report Status PENDING  Incomplete     Terri Piedra, [place] [facility]  01/10/2021  1:23 PM

## 2021-01-10 NOTE — Interval H&P Note (Signed)
History and Physical Interval Note:  01/10/2021 11:35 AM  Marc Rubio  has presented today for surgery, with the diagnosis of BACTEREMIA.  The various methods of treatment have been discussed with the patient and family. After consideration of risks, benefits and other options for treatment, the patient has consented to  Procedure(s): TRANSESOPHAGEAL ECHOCARDIOGRAM (TEE) (N/A) as a surgical intervention.  The patient's history has been reviewed, patient examined, no change in status, stable for surgery.  I have reviewed the patient's chart and labs.  Questions were answered to the patient's satisfaction.     Dorris Carnes

## 2021-01-11 DIAGNOSIS — A419 Sepsis, unspecified organism: Secondary | ICD-10-CM | POA: Diagnosis not present

## 2021-01-11 DIAGNOSIS — R7881 Bacteremia: Secondary | ICD-10-CM | POA: Diagnosis not present

## 2021-01-11 DIAGNOSIS — Z86010 Personal history of colonic polyps: Secondary | ICD-10-CM | POA: Diagnosis not present

## 2021-01-11 LAB — BODY FLUID CULTURE W GRAM STAIN: Culture: NO GROWTH

## 2021-01-11 LAB — CULTURE, BLOOD (ROUTINE X 2)
Culture: NO GROWTH
Culture: NO GROWTH
Special Requests: ADEQUATE
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
## 2021-01-12 DIAGNOSIS — R7881 Bacteremia: Secondary | ICD-10-CM | POA: Diagnosis not present

## 2021-01-12 DIAGNOSIS — Z86010 Personal history of colonic polyps: Secondary | ICD-10-CM | POA: Diagnosis not present

## 2021-01-12 DIAGNOSIS — A419 Sepsis, unspecified organism: Secondary | ICD-10-CM | POA: Diagnosis not present

## 2021-01-12 MED ORDER — SODIUM CHLORIDE 0.9% FLUSH
10.0000 mL | INTRAVENOUS | Status: DC | PRN
  Administered 2021-01-22: 10 mL

## 2021-01-12 MED ORDER — CHLORHEXIDINE GLUCONATE CLOTH 2 % EX PADS
6.0000 | MEDICATED_PAD | Freq: Every day | CUTANEOUS | Status: DC
  Administered 2021-01-12 – 2021-01-21 (×6): 6 via TOPICAL

## 2021-01-12 NOTE — TOC Progression Note (Signed)
Transition of Care [facility]) - Progression Note    Patient Details  Name: Marc Rubio MRN: [id number] Date of Birth: 09/22/1945  Transition of Care [facility]) CM/SW [place], [place] Phone Number: 01/12/2021, 4:38 PM  Clinical Narrative:    CSW attempted to get bed choice from pt, he stated I only want one in [place], and dismissed CSW. Pt has no HP offers at this time. SW will continue to follow for DC planning.   Expected Discharge Plan: [place] Barriers to Discharge: Continued Medical Work up  Expected Discharge Plan and Services Expected Discharge Plan: [place] Choice: [place] arrangements for the past 2 months: [place]: PT,OT [place]: [place] Date [place]: 01/07/21 Time [place]: [id number] Representative spoke with at [place]: [place] ([place]) Interventions    Readmission Risk Interventions No flowsheet data found.
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
## 2021-01-12 NOTE — Progress Notes (Signed)
Peripherally Inserted Central Catheter Placement  The IV Nurse has discussed with the patient and/or persons authorized to consent for the patient, the purpose of this procedure and the potential benefits and risks involved with this procedure.  The benefits include less needle sticks, lab draws from the catheter, and the patient may be discharged home with the catheter. Risks include, but not limited to, infection, bleeding, blood clot (thrombus formation), and puncture of an artery; nerve damage and irregular heartbeat and possibility to perform a PICC exchange if needed/ordered by physician.  Alternatives to this procedure were also discussed.  Bard Power PICC patient education guide, fact sheet on infection prevention and patient information card has been provided to patient /or left at bedside.    PICC Placement Documentation  PICC Single Lumen 08/65/78 Right Basilic 42 cm 0 cm (Active)  Indication for Insertion or Continuance of Line Prolonged intravenous therapies 01/12/21 0855  Exposed Catheter (cm) 0 cm 01/12/21 0855  Site Assessment Clean;Dry;Intact 01/12/21 0855  Line Status Flushed;Blood return noted;Saline locked 01/12/21 0855  Dressing Type Transparent 01/12/21 0855  Dressing Status Clean;Dry;Intact 01/12/21 0855  Antimicrobial disc in place? Yes 01/12/21 0855  Dressing Change Due 01/19/21 01/12/21 0855       Scotty Court 01/12/2021, 9:12 AM

## 2021-01-13 ENCOUNTER — Inpatient Hospital Stay (HOSPITAL_COMMUNITY): Payer: Medicare Other

## 2021-01-13 ENCOUNTER — Encounter (HOSPITAL_COMMUNITY): Payer: Self-pay | Admitting: Internal Medicine

## 2021-01-13 DIAGNOSIS — R933 Abnormal findings on diagnostic imaging of other parts of digestive tract: Secondary | ICD-10-CM | POA: Diagnosis not present

## 2021-01-13 DIAGNOSIS — Z86010 Personal history of colonic polyps: Secondary | ICD-10-CM | POA: Diagnosis not present

## 2021-01-13 DIAGNOSIS — R7881 Bacteremia: Secondary | ICD-10-CM | POA: Diagnosis not present

## 2021-01-13 DIAGNOSIS — A419 Sepsis, unspecified organism: Secondary | ICD-10-CM | POA: Diagnosis not present

## 2021-01-13 LAB — RESP PANEL BY RT-PCR (FLU A&B, COVID) ARPGX2
Influenza A by PCR: NEGATIVE
Influenza B by PCR: NEGATIVE
SARS Coronavirus 2 by RT PCR: POSITIVE — AB

## 2021-01-13 MED ORDER — PENICILLIN G POTASSIUM 20000000 UNITS IJ SOLR: 12.0000 10*6.[IU] | Freq: Two times a day (BID) | INTRAVENOUS | Status: DC

## 2021-01-13 MED ORDER — TRAMADOL HCL 50 MG PO TABS: 50.0000 mg | ORAL_TABLET | Freq: Four times a day (QID) | ORAL | 0 refills | Status: DC | PRN

## 2021-01-13 MED ORDER — GUAIFENESIN-DM 100-10 MG/5ML PO SYRP: 10.0000 mL | ORAL_SOLUTION | ORAL | Status: DC | PRN

## 2021-01-13 MED ORDER — ASCORBIC ACID 500 MG PO TABS
500.0000 mg | ORAL_TABLET | Freq: Every day | ORAL | Status: DC
  Administered 2021-01-13 – 2021-01-22 (×10): 500 mg via ORAL
  Filled 2021-01-13 (×10): qty 1

## 2021-01-13 MED ORDER — IPRATROPIUM BROMIDE HFA 17 MCG/ACT IN AERS
2.0000 | INHALATION_SPRAY | Freq: Four times a day (QID) | RESPIRATORY_TRACT | Status: DC
  Administered 2021-01-13: 2 via RESPIRATORY_TRACT
  Filled 2021-01-13: qty 12.9

## 2021-01-13 MED ORDER — ZINC SULFATE 220 (50 ZN) MG PO CAPS
220.0000 mg | ORAL_CAPSULE | Freq: Every day | ORAL | Status: DC
  Administered 2021-01-13 – 2021-01-22 (×10): 220 mg via ORAL
  Filled 2021-01-13 (×10): qty 1

## 2021-01-13 MED ORDER — HYDROCOD POLST-CPM POLST ER 10-8 MG/5ML PO SUER: 5.0000 mL | Freq: Two times a day (BID) | ORAL | Status: DC | PRN

## 2021-01-13 NOTE — Progress Notes (Signed)
Pt refusing finger sticks today. I educated him on why they are done and the consequences of not checking the blood sugar levels. He stated that he did not care and no one is "poking" him. Will cont to monitor.

## 2021-01-13 NOTE — Care Management Important Message (Signed)
Important Message  Patient Details  Name: Marc Rubio MRN: [id number] Date of Birth: 1946-03-03   Medicare Important Message Given:  Yes Patient has a precaution in place no IM given will mailed to the home address.     [name] 01/13/2021, 2:56 PM

## 2021-01-13 NOTE — Discharge Summary (Signed)
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 RT-PCR (Flu A&B, Covid) Nasopharyngeal Swab     Status: None   Collection Time: 01/04/21  9:25 PM   Specimen: Nasopharyngeal Swab; Nasopharyngeal(NP) swabs in vial transport medium  Result Value Ref Range Status   SARS Coronavirus 2 by RT PCR NEGATIVE NEGATIVE Final    Comment: (NOTE) SARS-CoV-2 target nucleic acids are NOT DETECTED.  The SARS-CoV-2 RNA is generally detectable in upper respiratory specimens during the acute phase of infection. The lowest concentration of SARS-CoV-2 viral copies this assay can detect is 138 copies/mL. A negative result does not preclude SARS-Cov-2 infection and should not be used as the sole basis for treatment or other patient management decisions. A negative result may occur with  improper specimen collection/handling, submission of specimen other than  nasopharyngeal swab, presence of viral mutation(s) within the areas targeted by this assay, and inadequate number of viral copies(<138 copies/mL). A negative result must be combined with clinical observations, patient history, and epidemiological information. The expected result is Negative.  Fact Sheet for Patients:  EntrepreneurPulse.com.au  Fact Sheet for Healthcare Providers:  IncredibleEmployment.be  This test is no t yet approved or cleared by the [place] FDA and  has been authorized for detection and/or diagnosis of SARS-CoV-2 by FDA under an Emergency Use Authorization (EUA). This EUA will remain  in effect (meaning this test can be used) for the duration of the COVID-19 declaration under Section 564(b)(1) of the Act, 21 U.S.C.section 360bbb-3(b)(1), unless the authorization is terminated  or revoked sooner.       Influenza A by PCR NEGATIVE NEGATIVE Final   Influenza B by PCR NEGATIVE NEGATIVE Final    Comment: (NOTE) The Xpert Xpress SARS-CoV-2/FLU/RSV plus assay is intended as an aid in the diagnosis of influenza from Nasopharyngeal swab specimens and should not be used as a sole basis for treatment. Nasal washings and aspirates are unacceptable for Xpert Xpress SARS-CoV-2/FLU/RSV testing.  Fact Sheet for Patients: EntrepreneurPulse.com.au  Fact Sheet for Healthcare Providers: IncredibleEmployment.be  This test is not yet approved or cleared by the [place] FDA and has been authorized for detection and/or diagnosis of SARS-CoV-2 by FDA under an Emergency Use Authorization (EUA). This EUA will remain in effect (meaning this test can be used) for the duration of the COVID-19 declaration under Section 564(b)(1) of the Act, 21 U.S.C. section 360bbb-3(b)(1), unless the authorization is terminated or revoked.  Performed at [facility] [address] [place], [place]  [address]   Urine culture     Status: Abnormal   Collection Time: 01/05/21 12:15 AM   Specimen: In/Out Cath Urine  Result Value Ref Range Status   Specimen Description   Final    IN/OUT CATH URINE Performed at [facility], [address] [place], [place] [address]    Special Requests   Final    NONE Performed at [facility], [address] [place], [place] [address]  Culture MULTIPLE SPECIES PRESENT, SUGGEST RECOLLECTION (A)  Final   Report Status 01/07/2021 FINAL  Final  MRSA PCR Screening     Status: None   Collection Time: 01/05/21  2:21 AM   Specimen: Nasopharyngeal  Result Value Ref Range Status   MRSA by PCR NEGATIVE NEGATIVE Final    Comment:        The GeneXpert MRSA Assay (FDA approved for NASAL specimens only), is one component of a comprehensive MRSA colonization surveillance program. It is not intended to diagnose MRSA infection nor to guide or monitor treatment for MRSA infections. Performed at [facility], [place] [address] [place], [place] [address]   Culture, blood (routine x 2)     Status: None   Collection Time: 01/06/21 11:33 AM   Specimen: BLOOD RIGHT HAND  Result Value Ref Range Status   Specimen Description BLOOD RIGHT HAND  Final   Special Requests   Final    BOTTLES DRAWN AEROBIC ONLY Blood Culture results may not be optimal due to an inadequate volume of blood received in culture bottles   Culture   Final    NO GROWTH 5 DAYS Performed at [facility], [place] [address], [place], [place] [address]    Report Status 01/11/2021 FINAL  Final  Culture, blood (routine x 2)     Status: None   Collection Time: 01/06/21 11:37 AM   Specimen: BLOOD LEFT HAND  Result Value Ref Range Status   Specimen Description BLOOD LEFT HAND  Final   Special Requests   Final    BOTTLES DRAWN AEROBIC ONLY Blood Culture adequate volume   Culture   Final    NO GROWTH 5 DAYS Performed at [facility], [place] [address],  [place], [place] [address]    Report Status 01/11/2021 FINAL  Final  Body fluid culture w Gram Stain     Status: None   Collection Time: 01/08/21  2:49 PM   Specimen: PATH Cytology Misc. fluid; Synovial Fluid  Result Value Ref Range Status   Specimen Description SYNOVIAL FLUID  Final   Special Requests SYNOVIAL FLUID  Final   Gram Stain   Final    MODERATE WBC PRESENT,BOTH PMN AND MONONUCLEAR NO ORGANISMS SEEN    Culture   Final    NO GROWTH 3 DAYS Performed at [facility], [address] [place], [place] [address]    Report Status 01/11/2021 FINAL  Final     Labs: BNP (last 3 results) Recent Labs    11/25/20 1822  BNP 67.5   Basic Metabolic Panel: Recent Labs  Lab 01/07/21 0443 01/10/21 0805  NA 135 132*  K 3.9 4.5  CL 100 98  CO2 25 27  GLUCOSE 149* 121*  BUN 18 15  CREATININE 0.70 0.71  CALCIUM 8.9 9.1   Liver Function Tests: Recent Labs  Lab 01/07/21 0443 01/10/21 0805  AST 16 24  ALT 13 15  ALKPHOS 67 76  BILITOT 0.3 0.1*  PROT 6.6 7.0  ALBUMIN 2.2* 2.4*   No results for input(s): LIPASE, AMYLASE in the last 168 hours. No results for input(s): AMMONIA in the last 168 hours. CBC: Recent Labs  Lab 01/07/21 0443 01/10/21 0805  WBC 7.1 8.6  NEUTROABS 5.3  --   HGB 10.8* 11.7*  HCT 33.8* 36.3*  MCV 93.9 94.0  PLT 366 430*   Cardiac Enzymes: No results for input(s): CKTOTAL, CKMB, CKMBINDEX, TROPONINI in the last 168 hours. BNP:  Invalid input(s): POCBNP CBG: Recent Labs  Lab 01/08/21 1148 01/09/21 0804 01/09/21 2045 01/10/21 0805 01/10/21 1315  GLUCAP 132* 147* 144* 126* 94   D-Dimer No results for input(s): DDIMER in the last 72 hours. Hgb A1c No results for input(s): HGBA1C in the last 72 hours. Lipid Profile No results for input(s): CHOL, HDL, LDLCALC, TRIG, CHOLHDL, LDLDIRECT in the last 72 hours. Thyroid function studies No results for input(s): TSH, T4TOTAL, T3FREE, THYROIDAB in the last 72 hours.  Invalid input(s):  FREET3 Anemia work up No results for input(s): VITAMINB12, FOLATE, FERRITIN, TIBC, IRON, RETICCTPCT in the last 72 hours. Urinalysis    Component Value Date/Time   COLORURINE YELLOW 01/05/2021 0015   APPEARANCEUR CLEAR 01/05/2021 0015   LABSPEC <1.005 (L) 01/05/2021 0015   PHURINE 6.5 01/05/2021 0015   GLUCOSEU NEGATIVE 01/05/2021 0015   HGBUR NEGATIVE 01/05/2021 0015   BILIRUBINUR NEGATIVE 01/05/2021 0015   KETONESUR NEGATIVE 01/05/2021 0015   PROTEINUR 30 (A) 01/05/2021 0015   NITRITE NEGATIVE 01/05/2021 0015   LEUKOCYTESUR NEGATIVE 01/05/2021 0015   Sepsis Labs Invalid input(s): PROCALCITONIN,  WBC,  LACTICIDVEN Microbiology Recent Results (from the past 240 hour(s))  Blood culture (routine single)     Status: Abnormal   Collection Time: 01/04/21  7:35 PM   Specimen: BLOOD LEFT FOREARM  Result Value Ref Range Status   Specimen Description BLOOD LEFT FOREARM BLOOD  Final   Special Requests   Final    BOTTLES DRAWN AEROBIC AND ANAEROBIC Blood Culture adequate volume   Culture  Setup Time   Final    GRAM POSITIVE COCCI IN CHAINS IN PAIRS IN BOTH AEROBIC AND ANAEROBIC BOTTLES CRITICAL RESULT CALLED TO, READ BACK BY AND VERIFIED WITH: CATHY PIERCE PHARMD @1512  01/05/21 EB    Culture STREPTOCOCCUS GROUP G (A)  Final   Report Status 01/07/2021 FINAL  Final   Organism ID, Bacteria STREPTOCOCCUS GROUP G  Final      Susceptibility   Streptococcus group g - MIC*    CLINDAMYCIN <=0.25 SENSITIVE Sensitive     AMPICILLIN <=0.25 SENSITIVE Sensitive     ERYTHROMYCIN <=0.12 SENSITIVE Sensitive     VANCOMYCIN 0.5 SENSITIVE Sensitive     CEFTRIAXONE <=0.12 SENSITIVE Sensitive     LEVOFLOXACIN 0.5 SENSITIVE Sensitive     PENICILLIN Value in next row Sensitive      SENSITIVE<=0.06Performed at [facility], [address], [place], [place] [address]    * STREPTOCOCCUS GROUP G  Blood Culture ID Panel (Reflexed)     Status: Abnormal   Collection Time: 01/04/21  7:35 PM  Result Value  Ref Range Status   Enterococcus faecalis NOT DETECTED NOT DETECTED Final   Enterococcus Faecium NOT DETECTED NOT DETECTED Final   Listeria monocytogenes NOT DETECTED NOT DETECTED Final   Staphylococcus species NOT DETECTED NOT DETECTED Final   Staphylococcus aureus (BCID) NOT DETECTED NOT DETECTED Final   Staphylococcus epidermidis NOT DETECTED NOT DETECTED Final   Staphylococcus lugdunensis NOT DETECTED NOT DETECTED Final   Streptococcus species DETECTED (A) NOT DETECTED Final    Comment: Not Enterococcus species, Streptococcus agalactiae, Streptococcus pyogenes, or Streptococcus pneumoniae. CRITICAL RESULT CALLED TO, READ BACK BY AND VERIFIED WITH: CATHY PIERCE PHARMD @1512  01/05/21 EB    Streptococcus agalactiae NOT DETECTED NOT DETECTED Final   Streptococcus pneumoniae NOT DETECTED NOT DETECTED Final   Streptococcus pyogenes NOT DETECTED NOT DETECTED Final   A.calcoaceticus-baumannii NOT DETECTED NOT DETECTED Final   Bacteroides fragilis NOT DETECTED NOT DETECTED Final  Enterobacterales NOT DETECTED NOT DETECTED Final   Enterobacter cloacae complex NOT DETECTED NOT DETECTED Final   Escherichia coli NOT DETECTED NOT DETECTED Final   Klebsiella aerogenes NOT DETECTED NOT DETECTED Final   Klebsiella oxytoca NOT DETECTED NOT DETECTED Final   Klebsiella pneumoniae NOT DETECTED NOT DETECTED Final   Proteus species NOT DETECTED NOT DETECTED Final   Salmonella species NOT DETECTED NOT DETECTED Final   Serratia marcescens NOT DETECTED NOT DETECTED Final   Haemophilus influenzae NOT DETECTED NOT DETECTED Final   Neisseria meningitidis NOT DETECTED NOT DETECTED Final   Pseudomonas aeruginosa NOT DETECTED NOT DETECTED Final   Stenotrophomonas maltophilia NOT DETECTED NOT DETECTED Final   Candida albicans NOT DETECTED NOT DETECTED Final   Candida auris NOT DETECTED NOT DETECTED Final   Candida glabrata NOT DETECTED NOT DETECTED Final   Candida krusei NOT DETECTED NOT DETECTED Final   Candida  parapsilosis NOT DETECTED NOT DETECTED Final   Candida tropicalis NOT DETECTED NOT DETECTED Final   Cryptococcus neoformans/gattii NOT DETECTED NOT DETECTED Final    Comment: Performed at [facility], [place] [address] [place], [place] [address]  Culture, blood (single)     Status: Abnormal   Collection Time: 01/04/21  9:00 PM   Specimen: BLOOD RIGHT HAND  Result Value Ref Range Status   Specimen Description   Final    BLOOD RIGHT HAND BLOOD Performed at [facility], [place] [place], [place] [address]    Special Requests   Final    BOTTLES DRAWN AEROBIC ONLY Blood Culture adequate volume Performed at [facility], [place]., [place], [place] [address]    Culture  Setup Time GRAM POSITIVE COCCI AEROBIC BOTTLE ONLY   Final   Culture (A)  Final    STREPTOCOCCUS GROUP G SUSCEPTIBILITIES PERFORMED ON PREVIOUS CULTURE WITHIN THE LAST 5 DAYS. Performed at [facility], [place] [address] [place], [place] [address]    Report Status 01/07/2021 FINAL  Final  Resp Panel by RT-PCR (Flu A&B, Covid) Nasopharyngeal Swab     Status: None   Collection Time: 01/04/21  9:25 PM   Specimen: Nasopharyngeal Swab; Nasopharyngeal(NP) swabs in vial transport medium  Result Value Ref Range Status   SARS Coronavirus 2 by RT PCR NEGATIVE NEGATIVE Final    Comment: (NOTE) SARS-CoV-2 target nucleic acids are NOT DETECTED.  The SARS-CoV-2 RNA is generally detectable in upper respiratory specimens during the acute phase of infection. The lowest concentration of SARS-CoV-2 viral copies this assay can detect is 138 copies/mL. A negative result does not preclude SARS-Cov-2 infection and should not be used as the sole basis for treatment or other patient management decisions. A negative result may occur with  improper specimen collection/handling, submission of specimen other than nasopharyngeal swab, presence of viral mutation(s) within the areas targeted by this  assay, and inadequate number of viral copies(<138 copies/mL). A negative result must be combined with clinical observations, patient history, and epidemiological information. The expected result is Negative.  Fact Sheet for Patients:  EntrepreneurPulse.com.au  Fact Sheet for Healthcare Providers:  IncredibleEmployment.be  This test is no t yet approved or cleared by the [place] FDA and  has been authorized for detection and/or diagnosis of SARS-CoV-2 by FDA under an Emergency Use Authorization (EUA). This EUA will remain  in effect (meaning this test can be used) for the duration of the COVID-19 declaration under Section 564(b)(1) of the Act, 21 U.S.C.section 360bbb-3(b)(1), unless  the authorization is terminated  or revoked sooner.       Influenza A by PCR NEGATIVE NEGATIVE Final   Influenza B by PCR NEGATIVE NEGATIVE Final    Comment: (NOTE) The Xpert Xpress SARS-CoV-2/FLU/RSV plus assay is intended as an aid in the diagnosis of influenza from Nasopharyngeal swab specimens and should not be used as a sole basis for treatment. Nasal washings and aspirates are unacceptable for Xpert Xpress SARS-CoV-2/FLU/RSV testing.  Fact Sheet for Patients: EntrepreneurPulse.com.au  Fact Sheet for Healthcare Providers: IncredibleEmployment.be  This test is not yet approved or cleared by the [place] FDA and has been authorized for detection and/or diagnosis of SARS-CoV-2 by FDA under an Emergency Use Authorization (EUA). This EUA will remain in effect (meaning this test can be used) for the duration of the COVID-19 declaration under Section 564(b)(1) of the Act, 21 U.S.C. section 360bbb-3(b)(1), unless the authorization is terminated or revoked.  Performed at [facility], [place]., [place], [place] [address]   Urine culture     Status: Abnormal   Collection Time: 01/05/21 12:15 AM    Specimen: In/Out Cath Urine  Result Value Ref Range Status   Specimen Description   Final    IN/OUT CATH URINE Performed at [facility], [place] [place], [place] [address]    Special Requests   Final    NONE Performed at [facility], [address] [place], [place] [address]    Culture MULTIPLE SPECIES PRESENT, SUGGEST RECOLLECTION (A)  Final   Report Status 01/07/2021 FINAL  Final  MRSA PCR Screening     Status: None   Collection Time: 01/05/21  2:21 AM   Specimen: Nasopharyngeal  Result Value Ref Range Status   MRSA by PCR NEGATIVE NEGATIVE Final    Comment:        The GeneXpert MRSA Assay (FDA approved for NASAL specimens only), is one component of a comprehensive MRSA colonization surveillance program. It is not intended to diagnose MRSA infection nor to guide or monitor treatment for MRSA infections. Performed at [facility], [place] [address] [place], [place] [address]   Culture, blood (routine x 2)     Status: None   Collection Time: 01/06/21 11:33 AM   Specimen: BLOOD RIGHT HAND  Result Value Ref Range Status   Specimen Description BLOOD RIGHT HAND  Final   Special Requests   Final    BOTTLES DRAWN AEROBIC ONLY Blood Culture results may not be optimal due to an inadequate volume of blood received in culture bottles   Culture   Final    NO GROWTH 5 DAYS Performed at [facility], [place] [address], [place], [place] [address]    Report Status 01/11/2021 FINAL  Final  Culture, blood (routine x 2)     Status: None   Collection Time: 01/06/21 11:37 AM   Specimen: BLOOD LEFT HAND  Result Value Ref Range Status   Specimen Description BLOOD LEFT HAND  Final   Special Requests   Final    BOTTLES DRAWN AEROBIC ONLY Blood Culture adequate volume   Culture   Final    NO GROWTH 5 DAYS Performed at [facility], [place] [address] [place],  [address]    Report Status 01/11/2021 FINAL  Final  Body fluid culture w Gram  Stain     Status: None   Collection Time: 01/08/21  2:49 PM   Specimen: PATH  Cytology Misc. fluid; Synovial Fluid  Result Value Ref Range Status   Specimen Description SYNOVIAL FLUID  Final   Special Requests SYNOVIAL FLUID  Final   Gram Stain   Final    MODERATE WBC PRESENT,BOTH PMN AND MONONUCLEAR NO ORGANISMS SEEN    Culture   Final    NO GROWTH 3 DAYS Performed at [facility], [address], [place], [place] [address]    Report Status 01/11/2021 FINAL  Final   Time spent: 30 min  SIGNED:   Marylu Lund, MD  [facility] 01/13/2021, 12:10 PM  If 7PM-7AM, please contact night-coverage

## 2021-01-13 NOTE — Progress Notes (Signed)
Patient has refused vitals at 4pm and also refused all CBG of the day
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
## 2021-01-13 NOTE — Treatment Plan (Signed)
Screening COVID test prior to SNF discharge became positive. Pt was COVID neg at time of presentation. Pt is not vaccinated. Currently on room air, largely asymptomatic. Will check repeat CXR. Given treatment for bacteremia, inflammatory markers are expected to already be elevated. Pt is now on isolation. Discussed with Social Work. Current SNF can no longer accept pt given his COVID+ status. SNF bed search resumes. For now, will recommend 10 day isolation. F/u on CXR which remains pending at this time. Discharge will be cancelled

## 2021-01-13 NOTE — TOC Progression Note (Addendum)
Transition of Care [facility]) - Progression Note    Patient Details  Name: Marc Rubio MRN: [id number] Date of Birth: June 12, 1946  Transition of Care [facility] CM/SW Contact  [name], LCSW Phone Number: 01/13/2021, 11:23 AM  Clinical Narrative:   CSW spoke to facilities regarding bed offers in [place]: [facility] and [facility] both unable to offer bed.  [facility] did offer bed.  CSW spoke with pt regarding this offer, he asked for specifics on location and then agreed to accept this offer.    SNF auth received from [employer]: 4 days starting 5/9. Review on 5/12 with [facility].  Ref #[id number].  MD notified, covid ordered.  1415: Pt covid test came back positive.  [facility] contacted, cannot take pt until 10 day quarantine complete.  MD notified.     Expected Discharge Plan: [place] Barriers to Discharge: Continued Medical Work up  Expected Discharge Plan and Services Expected Discharge Plan: [place] Choice: [place] arrangements for the past 2 months: [place]: [place],OT [place]: [place] Date [place]: 01/07/21 Time [place]: [id number] Representative spoke with at [place]: [place] ([place]) Interventions    Readmission Risk Interventions No flowsheet data found.

## 2021-01-13 NOTE — Progress Notes (Signed)
Physical Therapy Treatment Patient Details Name: Marc Rubio MRN: [id number] DOB: 1946/08/12 Today's Date: 01/13/2021    History of Present Illness 75 yo male with onset of L side back pain was admitted, has concern for intra abd infection with sepsis, noted aortic infection, ileus. Lt wrist gout vs septic arthritis PMHx:  abd aortic injury, endovasc stent graft repair, cancer of tonsil, dysphagia, PEG tube, chronic ileus, DM, L wrist gout, CAD, pulm fibrosis    PT Comments    Patient pre-medicated for pain (low back) one hour prior to session. Agreed to bed-level exercises to initiate session. (see below for details of exercises). Patient reporting back pain too severe to attempt OOB. Discussed rationale for mobilizing and role in decreasing pain and promoting strengthening. Unable to persuade patient to mobilize OOB.     Follow Up Recommendations  SNF     Equipment Recommendations  Hospital bed;Rolling walker with 5" wheels (walker he has been using not his and not available)    Recommendations for Other Services       Precautions / Restrictions Precautions Precautions: Fall Precaution Comments: monitor pain in back Restrictions Weight Bearing Restrictions: No Other Position/Activity Restrictions: L wrist pain    Mobility  Bed Mobility                    Transfers                    Ambulation/Gait                 Stairs             Wheelchair Mobility    Modified Rankin (Stroke Patients Only)       Balance                                            Cognition Arousal/Alertness: Awake/alert Behavior During Therapy: Flat affect                                   General Comments: limited ability to engage pt in conversation; following commands      Exercises General Exercises - Upper Extremity Shoulder Flexion: AROM;Both;Supine;5 reps General Exercises - Lower Extremity Heel Slides:  AROM;Both;10 reps;Supine Hip ABduction/ADduction: AROM;Both;10 reps;Supine (educated to bend stationary leg to protect low back) Heel Raises: AROM;Both;10 reps;Supine (pushing against foot board) Other Exercises Other Exercises: pt refused to attempt bridging as he says this will make his back hurt; encouraged to attempt isometric movement vs "baby bridge" and pt unwilling to attempt    General Comments        Pertinent Vitals/Pain Pain Assessment: Faces Faces Pain Scale: Hurts little more Pain Location: low back Pain Descriptors / Indicators: Guarding;Discomfort Pain Intervention(s): Limited activity within patient's tolerance;Monitored during session    Home Living                      Prior Function            PT Goals (current goals can now be found in the care plan section) Acute Rehab PT Goals Patient Stated Goal: to go home Progress towards PT goals: Not progressing toward goals - comment (back pain)    Frequency    Min 2X/week      PT  Plan Current plan remains appropriate (pt refusing SNF; family reports they can provide assist)    Co-evaluation              AM-PAC PT "6 Clicks" Mobility   Outcome Measure  Help needed turning from your back to your side while in a flat bed without using bedrails?: A Little Help needed moving from lying on your back to sitting on the side of a flat bed without using bedrails?: A Little Help needed moving to and from a bed to a chair (including a wheelchair)?: A Little Help needed standing up from a chair using your arms (e.g., wheelchair or bedside chair)?: A Little Help needed to walk in hospital room?: A Little Help needed climbing 3-5 steps with a railing? : A Lot 6 Click Score: 17    End of Session   Activity Tolerance: Patient limited by fatigue;Patient limited by pain (despite premedication 1 hour prior) Patient left: with call bell/phone within reach;in bed;with bed alarm set   PT Visit Diagnosis:  Unsteadiness on feet (R26.81);Muscle weakness (generalized) (M62.81)     Time: 1740-8144 PT Time Calculation (min) (ACUTE ONLY): 9 min  Charges:  $Therapeutic Exercise: 8-22 mins                      Arby Barrette, PT Pager [phone]    Rexanne Mano 01/13/2021, 12:36 PM

## 2021-01-13 NOTE — Progress Notes (Signed)
Lab called and notified me that the swab I sent to them is pos for COVID. I notified Dr. Wyline Copas and waiting for further instruction. Setting up ISO for pt.

## 2021-01-14 DIAGNOSIS — R7881 Bacteremia: Secondary | ICD-10-CM | POA: Diagnosis not present

## 2021-01-14 DIAGNOSIS — U071 COVID-19: Secondary | ICD-10-CM

## 2021-01-14 DIAGNOSIS — A419 Sepsis, unspecified organism: Secondary | ICD-10-CM | POA: Diagnosis not present

## 2021-01-14 LAB — GLUCOSE, CAPILLARY: Glucose-Capillary: 126 mg/dL — ABNORMAL HIGH (ref 70–99)

## 2021-01-14 MED ORDER — IPRATROPIUM BROMIDE HFA 17 MCG/ACT IN AERS
2.0000 | INHALATION_SPRAY | Freq: Four times a day (QID) | RESPIRATORY_TRACT | Status: DC | PRN
  Filled 2021-01-14: qty 12.9
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
## 2021-01-14 NOTE — Plan of Care (Signed)

## 2021-01-14 NOTE — Progress Notes (Addendum)
NT offered to take patient off of bed pan several times and patient still refused.    2:08 pm: NT asked if patient wanted to get off bed pan and patient agreed. No BM once bed pan was removed.

## 2021-01-14 NOTE — Progress Notes (Addendum)
Patient refusing CBG checks for the rest of the day, vitals, and progressive monitoring. It was explained to patient the importance of the monitoring and vital signs, however, despite all efforts, pt. Angrily refused. MD notified at bedside.  Nurse tech informed me that patient asked to get on the bed pan and NT went back to get patient off of bed pan and patient refused. Pt. States he wants to stay on the bed pan. I offered to take patient off and patient angrily said no. It was explained to patient that prolonged exposure on bed pan can cause redness, and sores and possible wounds, however, pt. Refused to get off. Call bell within reach and told patient to call out when finished using bed pan.

## 2021-01-15 DIAGNOSIS — U071 COVID-19: Secondary | ICD-10-CM | POA: Diagnosis not present

## 2021-01-15 DIAGNOSIS — E119 Type 2 diabetes mellitus without complications: Secondary | ICD-10-CM | POA: Diagnosis not present

## 2021-01-15 DIAGNOSIS — R7881 Bacteremia: Secondary | ICD-10-CM | POA: Diagnosis not present

## 2021-01-15 DIAGNOSIS — A419 Sepsis, unspecified organism: Secondary | ICD-10-CM | POA: Diagnosis not present

## 2021-01-15 NOTE — Progress Notes (Signed)
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 is normal.  3. The mitral valve is normal in structure. Trivial mitral valve regurgitation. No evidence of mitral stenosis.  4. The aortic valve is normal in structure. Aortic valve regurgitation is trivial. No aortic stenosis is present.  5. The inferior vena cava is normal in size with greater than 50% respiratory variability, suggesting right atrial pressure of 3 mmHg. FINDINGS  Left Ventricle: Left ventricular ejection fraction, by estimation, is 60 to 65%. The left ventricle has normal function. The left ventricle has no regional wall motion abnormalities. The left ventricular internal cavity size was normal in size. There is  mild left  ventricular hypertrophy. Left ventricular diastolic parameters are consistent with Grade I diastolic dysfunction (impaired relaxation). Right Ventricle: The right ventricular size is normal. No increase in right ventricular wall thickness. Right ventricular systolic function is normal. Left Atrium: Left atrial size was normal in size. Right Atrium: Right atrial size was normal in size. Pericardium: There is no evidence of pericardial effusion. Mitral Valve: The mitral valve is normal in structure. Trivial mitral valve regurgitation. No evidence of mitral valve stenosis. Tricuspid Valve: The tricuspid valve is normal in structure. Tricuspid valve regurgitation is trivial. No evidence of tricuspid stenosis. Aortic Valve: The aortic valve is normal in structure. Aortic valve regurgitation is trivial. No aortic stenosis is present. Aortic valve mean gradient measures 3.0 mmHg. Aortic valve peak gradient measures 5.9 mmHg. Aortic valve area, by VTI measures 2.51 cm. Pulmonic Valve: The pulmonic valve was normal in structure. Pulmonic valve regurgitation is not visualized. No evidence of pulmonic stenosis. Aorta: The aortic root is normal in size and structure. Venous: The inferior vena cava is normal in size with greater than 50% respiratory variability, suggesting right atrial pressure of 3 mmHg. IAS/Shunts: No atrial level shunt detected by color flow Doppler.  LEFT VENTRICLE PLAX 2D LVIDd:         4.00 cm  Diastology LVIDs:         2.50 cm  LV e' medial:    5.33 cm/s LV PW:         1.20 cm  LV E/e' medial:  10.0 LV IVS:        1.20 cm  LV e' lateral:   7.94 cm/s LVOT diam:     2.00 cm  LV E/e' lateral: 6.7 LV SV:         56 LV SV Index:   28 LVOT Area:     3.14 cm  RIGHT VENTRICLE RV S prime:     20.10 cm/s LEFT ATRIUM             Index       RIGHT ATRIUM          Index LA diam:        3.40 cm 1.69 cm/m  RA Area:     9.05 cm LA Vol (A2C):   51.0 ml 25.34 ml/m RA Volume:   15.10 ml 7.50 ml/m LA Vol (A4C):   39.9  ml 19.82 ml/m LA Biplane Vol: 46.1 ml 22.90 ml/m  AORTIC VALVE AV Area (Vmax):  2.70 cm AV Area (Vmean):   2.60 cm AV Area (VTI):     2.51 cm AV Vmax:           121.00 cm/s AV Vmean:          76.500 cm/s AV VTI:            0.223 m AV Peak Grad:      5.9 mmHg AV Mean Grad:      3.0 mmHg LVOT Vmax:         104.00 cm/s LVOT Vmean:        63.300 cm/s LVOT VTI:          0.178 m LVOT/AV VTI ratio: 0.80  AORTA Ao Root diam: 3.40 cm Ao Asc diam:  3.30 cm MITRAL VALVE MV Area (PHT): 3.99 cm     SHUNTS MV Decel Time: 190 msec     Systemic VTI:  0.18 m MV E velocity: 53.30 cm/s   Systemic Diam: 2.00 cm MV A velocity: 104.00 cm/s MV E/A ratio:  0.51 Candee Furbish MD Electronically signed by Candee Furbish MD Signature Date/Time: 01/07/2021/3:43:01 PM    Final    [place] EKG SITE RITE  Result Date: 01/10/2021 If Site Rite image not attached, placement could not be confirmed due to current cardiac rhythm.

## 2021-01-15 NOTE — Progress Notes (Addendum)
Attempted to connect patient on the progressive monitor and patient angrily refused, and stated, "I don't want none of that on me." Patient stated he would only allow [place] to check his vitals once per day. It was explained to patient the reason why it is so important to check frequent vitals/moniotring, however, pt. Still angrily refused.

## 2021-01-15 NOTE — Progress Notes (Signed)
NT came to inform me that he attempted to check pt.'s vitals signs and pt. Refused. NT offered to take patient off bed pan since pt. Has been on bed pan since 10:30am and pt. Refused.   10:40am: Attempted to change patient's peg tube dressing and patient angrily said, "I don't want you to touch my dressing."

## 2021-01-15 NOTE — TOC Progression Note (Addendum)
Transition of Care [facility]) - Progression Note    Patient Details  Name: Marc Rubio MRN: [id number] Date of Birth: November 09, 1945  Transition of Care [facility]) CM/SW Contact  [name], LCSW Phone Number: 01/15/2021, 2:48 PM  Clinical Narrative:  CSW spoke with pt about plan to send him to SNF once he has completed his covid quarantine and pt is in agreement with this plan, still wants to go to [facility].  CSW LM with Carollee Leitz at [facility] to confirm they will keep pt on their list for admission once he is cleared.  1520: Message from Carollee Leitz, she will plan on admission after quarantine period is completed.       Expected Discharge Plan: [place] Barriers to Discharge: Continued Medical Work up  Expected Discharge Plan and Services Expected Discharge Plan: [place]: [place] arrangements for the past 2 months: Single Family Home Expected Discharge Date: 01/13/21                         HH Arranged: PT,OT [place] Agency: [place] Date [facility] Agency Contacted: 01/07/21 Time [place]: [id number] Representative spoke with at [place]: [place] ([place]) Interventions    Readmission Risk Interventions No flowsheet data found.

## 2021-01-15 NOTE — Progress Notes (Addendum)
Occupational Therapy Treatment Patient Details Name: Marc Rubio MRN: [id number] DOB: 1945/10/06 Today's Date: 01/15/2021    History of present illness 75 yo male with onset of L side back pain was admitted, has concern for intra abd infection with sepsis, noted aortic infection, ileus. 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 COVID(+) 5/9. PMHx: abd aortic injury, endovasc stent graft repair, cancer of tonsil, dysphagia, PEG tube, chronic ileus, DM, L wrist gout, CAD, pulm fibrosis   OT comments  Patient not progressing toward goals this date refusing most ADLs, therapeutic activity and therapeutic exercise. Patient also refusing L wrist splint. Patient reports anger about COVID (+) status. This OT acknowledged patients concerns. Time spent educating patient on purpose of OT and benefit of participation to maximize safety and independence with self-care tasks in prep for safe d/c to next level of care. Patient expressed verbal understanding but continued to refuse. Session terminated. OT will continue efforts.    Follow Up Recommendations  SNF;Supervision - Intermittent    Equipment Recommendations  3 in 1 bedside commode    Recommendations for Other Services      Precautions / Restrictions Precautions Precautions: Fall Precaution Comments: monitor pain in back, L wrist splint Restrictions Weight Bearing Restrictions: No       Mobility Bed Mobility               General bed mobility comments: Refused    Transfers                      Balance                                           ADL either performed or assessed with clinical judgement   ADL       Grooming: Set up;Bed level Grooming Details (indicate cue type and reason): 1/3 grooming tasks in supine.                               General ADL Comments: Patient self limiting. Refuses most tasks. Education provided on benefit of participation with therapy services to  maximize independence with self-care tasks. Patient expressed verbal understanding but continued to refuse most tasks this date.     Vision       Perception     Praxis      Cognition Arousal/Alertness: Awake/alert Behavior During Therapy: Flat affect                                   General Comments: Patient refuses to engage in most conversation with this therapist. States "You might as well leave" in response to this therapists attemtps to engage in therapeutic activities.        Exercises     Shoulder Instructions       General Comments Has L wrist splint but refusing to wear. Refused repositioning on bedpan. No reason specified. Patient very upset at Marc Rubio (+) status. Tells this therapist "You may as well just leave. I'm not doing anything today."    Pertinent Vitals/ Pain       Pain Assessment: Faces Faces Pain Scale: Hurts little more Pain Location: low back, L wrist Pain Descriptors / Indicators: Sore;Grimacing;Guarding Pain Intervention(s): Limited activity within patient's tolerance;Repositioned;Monitored during session  Home Living                                          Prior Functioning/Environment              Frequency  Min 2X/week        Progress Toward Goals  OT Goals(current goals can now be found in the care plan section)  Progress towards OT goals: Not progressing toward goals - comment  Acute Rehab OT Goals Patient Stated Goal: to go home OT Goal Formulation: With patient Time For Goal Achievement: 01/20/21 Potential to Achieve Goals: Good ADL Goals Pt Will Perform Grooming: with min guard assist;standing Pt Will Perform Upper Body Dressing: with modified independence;sitting Pt Will Perform Lower Body Dressing: with supervision;sit to/from stand Pt Will Transfer to Toilet: with min guard assist;ambulating;bedside commode Additional ADL Goal #1: Pt will increase to supervisionA for ADL functional  mobility and transfers to increase independence.  Plan Discharge plan remains appropriate;Frequency remains appropriate    Co-evaluation                 AM-PAC OT "6 Clicks" Daily Activity     Outcome Measure   Help from another person eating meals?: None Help from another person taking care of personal grooming?: A Little Help from another person toileting, which includes using toliet, bedpan, or urinal?: A Lot Help from another person bathing (including washing, rinsing, drying)?: A Lot Help from another person to put on and taking off regular upper body clothing?: A Little Help from another person to put on and taking off regular lower body clothing?: A Lot 6 Click Score: 16    End of Session    OT Visit Diagnosis: Unsteadiness on feet (R26.81);Muscle weakness (generalized) (M62.81);Pain Pain - Right/Left: Left Pain - part of body: Hand (low back)   Activity Tolerance Other (comment) (Patient refusing most ADLs/therapuetic activity/therapeutic exercise)   Patient Left in bed;with call bell/phone within reach   Nurse Communication          Time: 1050-1110 OT Time Calculation (min): 20 min  Charges: OT General Charges $OT Visit: 1 Visit OT Treatments $Therapeutic Activity: 8-22 mins  Marc Rubio H. OTR/L Supplemental OT, Department of rehab services [phone]   Marc Rubio R H. 01/15/2021, 11:22 AM

## 2021-01-15 NOTE — Plan of Care (Signed)

## 2021-01-15 NOTE — Progress Notes (Signed)
Pt refused midnight and 4am vitals. Pt educated on importance of compliance with medical treatment, pt still refusing.

## 2021-01-16 DIAGNOSIS — A419 Sepsis, unspecified organism: Secondary | ICD-10-CM | POA: Diagnosis not present

## 2021-01-16 LAB — SARS CORONAVIRUS 2 (TAT 6-24 HRS): SARS Coronavirus 2: POSITIVE — AB

## 2021-01-16 NOTE — Progress Notes (Signed)
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 ventricular size is normal. No increase in right  ventricular wall thickness. Right ventricular systolic function is normal. Left Atrium: Left atrial size was normal in size. Right Atrium: Right atrial size was normal in size. Pericardium: There is no evidence of pericardial effusion. Mitral Valve: The mitral valve is normal in structure. Trivial mitral valve regurgitation. No evidence of mitral valve stenosis. Tricuspid Valve: The tricuspid valve is normal in structure. Tricuspid valve regurgitation is trivial. No evidence of tricuspid stenosis. Aortic Valve: The aortic valve is normal in structure. Aortic valve regurgitation is trivial. No aortic stenosis is present. Aortic valve mean gradient measures 3.0 mmHg. Aortic valve peak gradient measures 5.9  mmHg. Aortic valve area, by VTI measures 2.51 cm. Pulmonic Valve: The pulmonic valve was normal in structure. Pulmonic valve regurgitation is not visualized. No evidence of pulmonic stenosis. Aorta: The aortic root is normal in size and structure. Venous: The inferior vena cava is normal in size with greater than 50% respiratory variability, suggesting right atrial pressure of 3 mmHg. IAS/Shunts: No atrial level shunt detected by color flow Doppler.  LEFT VENTRICLE PLAX 2D LVIDd:         4.00 cm  Diastology LVIDs:         2.50 cm  LV e' medial:    5.33 cm/s LV PW:         1.20 cm  LV E/e' medial:  10.0 LV IVS:        1.20 cm  LV e' lateral:   7.94 cm/s LVOT diam:     2.00 cm  LV E/e' lateral: 6.7 LV SV:         56 LV SV Index:   28 LVOT Area:     3.14 cm  RIGHT VENTRICLE RV S prime:     20.10 cm/s LEFT ATRIUM             Index       RIGHT ATRIUM          Index LA diam:        3.40 cm 1.69 cm/m  RA Area:     9.05 cm LA Vol (A2C):   51.0 ml 25.34 ml/m RA Volume:   15.10 ml 7.50 ml/m LA Vol (A4C):   39.9 ml 19.82 ml/m LA Biplane Vol: 46.1 ml 22.90 ml/m  AORTIC VALVE AV Area (Vmax):    2.70 cm AV Area (Vmean):   2.60 cm AV Area (VTI):     2.51 cm AV Vmax:           121.00 cm/s AV Vmean:          76.500 cm/s AV VTI:            0.223 m AV Peak Grad:      5.9 mmHg AV Mean Grad:      3.0 mmHg LVOT Vmax:         104.00 cm/s LVOT Vmean:        63.300 cm/s LVOT VTI:          0.178 m LVOT/AV VTI ratio: 0.80  AORTA Ao Root diam: 3.40 cm Ao Asc diam:  3.30 cm MITRAL VALVE MV Area (PHT): 3.99 cm     SHUNTS MV Decel Time: 190 msec     Systemic VTI:  0.18 m MV E velocity: 53.30 cm/s   Systemic Diam: 2.00 cm MV A velocity: 104.00 cm/s MV E/A ratio:  0.51 Candee Furbish MD Electronically signed by Candee Furbish MD Signature Date/Time: 01/07/2021/3:43:01 PM    Final    [place]  EKG SITE RITE  Result Date: 01/10/2021 If Site Rite image not attached, placement could not be confirmed due to current cardiac rhythm.

## 2021-01-16 NOTE — Progress Notes (Signed)
Pt refused all vital signs for this shift.

## 2021-01-16 NOTE — Progress Notes (Signed)
PT Cancellation Note  Patient Details Name: Marc Rubio MRN: [id number] DOB: 07/21/1946   Cancelled Treatment:    Reason Eval/Treat Not Completed: Patient declined, no reason specified  Patient repeatedly stating "bye" as therapist trying to discuss role of PT and benefits of increasing activity. Attempted to elicit a schedule that would work better for patient (a.m. vs p.m.) and again pt not willing to discuss and repeating, "bye." When asked if he would work with PT if we come back tomorrow, he stated, "not today!"  Will continue efforts to gain pt's participation with PT.   Arby Barrette, PT Pager [phone]   Rexanne Mano 01/16/2021, 10:20 AM

## 2021-01-17 NOTE — Plan of Care (Signed)
  Problem: Clinical Measurements: Goal: Cardiovascular complication will be avoided Outcome: Progressing   Problem: Activity: Goal: Risk for activity intolerance will decrease Outcome: Progressing   Problem: Coping: Goal: Level of anxiety will decrease Outcome: Progressing   Problem: Pain Managment: Goal: General experience of comfort will improve Outcome: Progressing   

## 2021-01-17 NOTE — Progress Notes (Signed)
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 function. The left ventricle has no regional wall motion abnormalities. There is mild left ventricular hypertrophy. Left ventricular  diastolic parameters are consistent with Grade I diastolic dysfunction (impaired relaxation).  2. Right ventricular systolic function is normal. The right ventricular size is normal.  3. The mitral valve is normal in structure. Trivial mitral valve regurgitation. No evidence of mitral stenosis.  4. The aortic valve is normal in structure. Aortic valve regurgitation is trivial. No aortic stenosis is present.  5. The inferior vena cava is normal in size with greater than 50% respiratory variability, suggesting right atrial pressure of 3 mmHg.  FINDINGS  Left Ventricle: Left ventricular ejection fraction, by estimation, is 60 to 65%. The left ventricle has normal function. The left ventricle has no regional wall motion abnormalities. The left ventricular internal cavity size was normal in size. There is  mild left ventricular hypertrophy. Left ventricular diastolic parameters are consistent with Grade I diastolic dysfunction (impaired relaxation). Right Ventricle: The right ventricular size is normal. No increase in right ventricular wall thickness. Right ventricular systolic function is normal. Left Atrium: Left atrial size was normal in size. Right Atrium: Right atrial size was normal in size. Pericardium: There is no evidence of pericardial effusion. Mitral Valve: The mitral valve is normal in structure. Trivial mitral valve regurgitation. No evidence of mitral valve stenosis. Tricuspid Valve: The tricuspid valve is normal in structure. Tricuspid valve regurgitation is trivial. No evidence of tricuspid stenosis. Aortic Valve: The aortic valve is normal in structure. Aortic valve regurgitation is trivial. No aortic stenosis is present. Aortic valve mean gradient measures 3.0 mmHg. Aortic valve peak gradient measures 5.9 mmHg. Aortic valve area, by VTI measures 2.51 cm. Pulmonic Valve: The pulmonic valve was normal in structure. Pulmonic valve regurgitation is not visualized. No evidence of pulmonic stenosis. Aorta: The aortic root is normal in size and structure. Venous: The inferior vena cava is normal in size with greater than 50% respiratory variability, suggesting right atrial pressure of 3 mmHg. IAS/Shunts: No atrial level shunt detected by color flow Doppler.  LEFT VENTRICLE PLAX 2D LVIDd:         4.00 cm  Diastology LVIDs:         2.50 cm  LV e' medial:    5.33 cm/s LV PW:         1.20 cm  LV E/e' medial:  10.0 LV IVS:        1.20 cm  LV e' lateral:   7.94 cm/s LVOT diam:     2.00 cm  LV E/e' lateral: 6.7 LV SV:         56 LV SV Index:   28 LVOT  Area:     3.14 cm  RIGHT VENTRICLE RV S prime:     20.10 cm/s LEFT ATRIUM             Index       RIGHT ATRIUM          Index LA diam:        3.40 cm 1.69 cm/m  RA Area:     9.05 cm LA Vol (A2C):   51.0 ml 25.34 ml/m RA Volume:   15.10 ml 7.50 ml/m LA Vol (A4C):   39.9 ml 19.82 ml/m LA Biplane Vol: 46.1 ml 22.90 ml/m  AORTIC VALVE AV Area (Vmax):    2.70 cm AV Area (Vmean):   2.60 cm AV Area (VTI):     2.51 cm AV Vmax:           121.00 cm/s AV Vmean:          76.500  cm/s AV VTI:            0.223 m AV Peak Grad:      5.9 mmHg AV Mean Grad:      3.0 mmHg LVOT Vmax:         104.00 cm/s LVOT Vmean:        63.300 cm/s LVOT VTI:          0.178 m LVOT/AV VTI ratio: 0.80  AORTA Ao Root diam: 3.40 cm Ao Asc diam:  3.30 cm MITRAL VALVE MV Area (PHT): 3.99 cm     SHUNTS MV Decel Time: 190 msec     Systemic VTI:  0.18 m MV E velocity: 53.30 cm/s   Systemic Diam: 2.00 cm MV A velocity: 104.00 cm/s MV E/A ratio:  0.51 Candee Furbish MD Electronically signed by Candee Furbish MD Signature Date/Time: 01/07/2021/3:43:01 PM    Final    [place] EKG SITE RITE  Result Date: 01/10/2021 If Site Rite image not attached, placement could not be confirmed due to current cardiac rhythm.

## 2021-01-17 NOTE — Progress Notes (Signed)
Physical Therapy Treatment Patient Details Name: Marc Rubio MRN: [id number] DOB: 07-Apr-1946 Today's Date: 01/17/2021    History of Present Illness 75 yo male with onset of L side back pain was admitted, has concern for intra abd infection with sepsis, noted aortic infection, ileus. 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 COVID(+) 5/9. PMHx: abd aortic injury, endovasc stent graft repair, cancer of tonsil, dysphagia, PEG tube, chronic ileus, DM, L wrist gout, CAD, pulm fibrosis    PT Comments    Pt is in too much pain to "push" through the mobility needed to make good improvement.  He is reluctant to participate in general and specifically in doing tasks not tried in days.  Emphasis on warm up bed exercise, rolling, transition via R elbow to sitting.  From this point, pt needed to have a stool, but was adamant that he would not try getting to the [facility] and forced return to supine for using a bed pan.   Multiple rolls to reposition up in bed and place the pan.    Follow Up Recommendations  SNF     Equipment Recommendations  Hospital bed;Rolling walker with 5" wheels    Recommendations for Other Services       Precautions / Restrictions Precautions Precautions: Fall    Mobility  Bed Mobility Overal bed mobility: Needs Assistance Bed Mobility: Rolling;Sidelying to Sit;Sit to Sidelying Rolling: Min assist Sidelying to sit: Mod assist;Max assist     Sit to sidelying: Max assist General bed mobility comments: used hook lying and hook method to assist rolling for egress and to place bed pan as pt refused transfer to [facility] once already up sitting EOB.    Transfers                 General transfer comment: pt declined standing or getting to the [facility] for toileting.  pt was adamant about using bed pan  Ambulation/Gait                 Stairs             Wheelchair Mobility    Modified Rankin (Stroke Patients Only)       Balance   Sitting-balance support:  Bilateral upper extremity supported;Feet supported Sitting balance-Leahy Scale: Fair Sitting balance - Comments: preferring to use BIL UE's                                    Cognition Arousal/Alertness: Awake/alert Behavior During Therapy: Flat affect Overall Cognitive Status: Difficult to assess                                        Exercises Other Exercises Other Exercises: bil hip/knee flex/ext ROM with graded assist in flexion and graded resistance in gross extension. x 10 reps. Other Exercises: bicep/tricep presses x10 reps with graded resistance and input through forearm on the L UE x10 reps Other Exercises: gross shd flex/ext with graded concentric and eccentric resistance x 10 reps.    General Comments General comments (skin integrity, edema, etc.): pt report extreme back pain throughout every facet of therapy.  Maximal encouragement needed to accomplish any activity.      Pertinent Vitals/Pain Pain Assessment: Faces Faces Pain Scale: Hurts even more Pain Location: low back, L wrist Pain Descriptors / Indicators: Sore;Grimacing;Guarding Pain Intervention(s): Limited  activity within patient's tolerance;Monitored during session    Home Living                      Prior Function            PT Goals (current goals can now be found in the care plan section) Acute Rehab PT Goals Patient Stated Goal: to go home Progress towards PT goals: Progressing toward goals    Frequency    Min 2X/week      PT Plan Current plan remains appropriate    Co-evaluation              AM-PAC PT "6 Clicks" Mobility   Outcome Measure  Help needed turning from your back to your side while in a flat bed without using bedrails?: A Little Help needed moving from lying on your back to sitting on the side of a flat bed without using bedrails?: A Lot Help needed moving to and from a bed to a chair (including a wheelchair)?: A Lot Help  needed standing up from a chair using your arms (e.g., wheelchair or bedside chair)?: A Lot Help needed to walk in hospital room?: A Lot Help needed climbing 3-5 steps with a railing? : A Lot 6 Click Score: 13    End of Session   Activity Tolerance: Patient tolerated treatment well;Patient limited by pain Patient left: with call bell/phone within reach;in bed;with bed alarm set;Other (comment) (on bed pan RN made aware.) Nurse Communication: Mobility status PT Visit Diagnosis: Muscle weakness (generalized) (M62.81);Other abnormalities of gait and mobility (R26.89);Pain Pain - part of body:  (back)     Time: 4650-3546 PT Time Calculation (min) (ACUTE ONLY): 29 min  Charges:  $Therapeutic Exercise: 8-22 mins $Therapeutic Activity: 8-22 mins                     01/17/2021  Marc Carne., PT Acute Rehabilitation Services [phone]  (pager) [phone]  (office)   Marc Rubio 01/17/2021, 5:16 PM

## 2021-01-18 NOTE — Progress Notes (Signed)
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    Cammy Brochure Referring Phys: [id number] [place]  1. Left ventricular ejection fraction, by estimation, is 60 to 65%.  The left ventricle has normal function. The left ventricle has no regional wall motion abnormalities. There is mild left ventricular hypertrophy. Left ventricular diastolic parameters are consistent with Grade I diastolic dysfunction (impaired relaxation).  2. Right ventricular systolic function is normal. The right ventricular size is normal.  3. The mitral valve is normal in structure. Trivial mitral valve regurgitation. No evidence of mitral stenosis.  4. The aortic valve is normal in structure. Aortic valve regurgitation is trivial. No aortic stenosis is present.  5. The inferior vena cava  is normal in size with greater than 50% respiratory variability, suggesting right atrial pressure of 3 mmHg. FINDINGS  Left Ventricle: Left ventricular ejection fraction, by estimation, is 60 to 65%. The left ventricle has normal function. The left ventricle has no regional wall motion abnormalities. The left ventricular internal cavity size was normal in size. There is  mild left ventricular hypertrophy. Left ventricular diastolic parameters are consistent with Grade I diastolic dysfunction (impaired relaxation). Right Ventricle: The right ventricular size is normal. No increase in right ventricular wall thickness. Right ventricular systolic function is normal. Left Atrium: Left atrial size was normal in size. Right Atrium: Right atrial size was normal in size. Pericardium: There is no evidence of pericardial effusion. Mitral Valve: The mitral valve is normal in structure. Trivial mitral valve regurgitation. No evidence of mitral valve stenosis. Tricuspid Valve: The tricuspid valve is normal in structure. Tricuspid valve regurgitation is trivial. No evidence of tricuspid stenosis. Aortic Valve: The aortic valve is normal in structure. Aortic valve regurgitation is trivial. No aortic stenosis is present. Aortic valve mean gradient measures 3.0 mmHg. Aortic valve peak gradient measures 5.9 mmHg. Aortic valve area, by VTI measures 2.51 cm. Pulmonic Valve: The pulmonic valve was normal in structure. Pulmonic valve regurgitation is not visualized. No evidence of pulmonic stenosis. Aorta: The aortic root is normal in size and structure. Venous: The inferior vena cava is normal in size with greater than 50% respiratory variability, suggesting right atrial pressure of 3 mmHg. IAS/Shunts: No atrial level shunt detected by color flow Doppler.  LEFT VENTRICLE PLAX 2D LVIDd:         4.00 cm  Diastology LVIDs:         2.50 cm  LV e' medial:    5.33 cm/s LV PW:         1.20 cm  LV E/e' medial:  10.0 LV IVS:        1.20 cm  LV  e' lateral:   7.94 cm/s LVOT diam:     2.00 cm  LV E/e' lateral: 6.7 LV SV:         56 LV SV Index:   28 LVOT Area:     3.14 cm  RIGHT VENTRICLE RV S prime:     20.10 cm/s LEFT ATRIUM             Index       RIGHT ATRIUM          Index LA diam:        3.40 cm 1.69 cm/m  RA Area:     9.05 cm LA Vol (A2C):   51.0 ml 25.34 ml/m RA Volume:   15.10 ml 7.50 ml/m LA Vol (A4C):   39.9 ml 19.82 ml/m LA Biplane Vol: 46.1 ml 22.90 ml/m  AORTIC VALVE AV Area (Vmax):    2.70 cm AV Area (Vmean):   2.60 cm AV Area (VTI):     2.51 cm AV Vmax:  121.00 cm/s AV Vmean:          76.500 cm/s AV VTI:            0.223 m AV Peak Grad:      5.9 mmHg AV Mean Grad:      3.0 mmHg LVOT Vmax:         104.00 cm/s LVOT Vmean:        63.300 cm/s LVOT VTI:          0.178 m LVOT/AV VTI ratio: 0.80  AORTA Ao Root diam: 3.40 cm Ao Asc diam:  3.30 cm MITRAL VALVE MV Area (PHT): 3.99 cm     SHUNTS MV Decel Time: 190 msec     Systemic VTI:  0.18 m MV E velocity: 53.30 cm/s   Systemic Diam: 2.00 cm MV A velocity: 104.00 cm/s MV E/A ratio:  0.51 Candee Furbish MD Electronically signed by Candee Furbish MD Signature Date/Time: 01/07/2021/3:43:01 PM    Final    [place] EKG SITE RITE  Result Date: 01/10/2021 If Site Rite image not attached, placement could not be confirmed due to current cardiac rhythm.

## 2021-01-18 NOTE — Progress Notes (Signed)
Pt refused telemetry monitor at this time. Importance of cardiac monitor explained to pt and pt verbalized understanding.

## 2021-01-19 LAB — CBC
HCT: 33.3 % — ABNORMAL LOW (ref 39.0–52.0)
Hemoglobin: 10.7 g/dL — ABNORMAL LOW (ref 13.0–17.0)
MCH: 29.6 pg (ref 26.0–34.0)
MCHC: 32.1 g/dL (ref 30.0–36.0)
MCV: 92 fL (ref 80.0–100.0)
Platelets: 399 10*3/uL (ref 150–400)
RBC: 3.62 MIL/uL — ABNORMAL LOW (ref 4.22–5.81)
RDW: 15 % (ref 11.5–15.5)
WBC: 6.1 10*3/uL (ref 4.0–10.5)
nRBC: 0 % (ref 0.0–0.2)

## 2021-01-19 LAB — BASIC METABOLIC PANEL
Anion gap: 6 (ref 5–15)
BUN: 16 mg/dL (ref 8–23)
CO2: 28 mmol/L (ref 22–32)
Calcium: 8.7 mg/dL — ABNORMAL LOW (ref 8.9–10.3)
Chloride: 96 mmol/L — ABNORMAL LOW (ref 98–111)
Creatinine, Ser: 0.65 mg/dL (ref 0.61–1.24)
GFR, Estimated: >60 mL/min (ref >60)
Glucose, Bld: 116 mg/dL — ABNORMAL HIGH (ref 70–99)
Potassium: 3.9 mmol/L (ref 3.5–5.1)
Sodium: 130 mmol/L — ABNORMAL LOW (ref 135–145)

## 2021-01-19 NOTE — Progress Notes (Signed)
PEG tube patent, 462ml osmolite 1.5 cal given via tube.  Flashed appropriately.  Pt tolerated well.

## 2021-01-19 NOTE — Progress Notes (Signed)
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 regurgitation. No evidence of mitral stenosis.  4. The aortic valve is normal in structure. Aortic valve regurgitation is trivial. No aortic stenosis is  present.  5. The inferior vena cava is normal in size with greater than 50% respiratory variability, suggesting right atrial pressure of 3 mmHg. FINDINGS  Left Ventricle: Left ventricular ejection fraction, by estimation, is 60 to 65%. The left ventricle has normal function. The left ventricle has no regional wall motion abnormalities. The left ventricular internal cavity size was normal in size. There is  mild left ventricular hypertrophy. Left ventricular diastolic parameters are consistent with Grade I diastolic dysfunction (impaired  relaxation). Right Ventricle: The right ventricular size is normal. No increase in right ventricular wall thickness. Right ventricular systolic function is normal. Left Atrium: Left atrial size was normal in size. Right Atrium: Right atrial size was normal in size. Pericardium: There is no evidence of pericardial effusion. Mitral Valve: The mitral valve is normal in structure. Trivial mitral valve regurgitation. No evidence of mitral valve stenosis. Tricuspid Valve: The tricuspid valve is normal in structure. Tricuspid valve regurgitation is trivial. No evidence of tricuspid stenosis. Aortic Valve: The aortic valve is normal in structure. Aortic valve regurgitation is trivial. No aortic stenosis is present. Aortic valve mean gradient measures 3.0 mmHg. Aortic valve peak gradient measures 5.9 mmHg. Aortic valve area, by VTI measures 2.51 cm. Pulmonic Valve: The pulmonic valve was normal in structure. Pulmonic valve regurgitation is not visualized. No evidence of pulmonic stenosis. Aorta: The aortic root is normal in size and structure. Venous: The inferior vena cava is normal in size with greater than 50% respiratory variability, suggesting right atrial pressure of 3 mmHg. IAS/Shunts: No atrial level shunt detected by color flow Doppler.  LEFT VENTRICLE PLAX 2D LVIDd:         4.00 cm  Diastology LVIDs:         2.50 cm  LV e' medial:    5.33 cm/s LV PW:         1.20 cm  LV E/e' medial:  10.0 LV IVS:        1.20 cm  LV e' lateral:   7.94 cm/s LVOT diam:     2.00 cm  LV E/e' lateral: 6.7 LV SV:         56 LV SV Index:   28 LVOT Area:     3.14 cm  RIGHT VENTRICLE RV S prime:     20.10 cm/s LEFT ATRIUM             Index       RIGHT ATRIUM          Index LA diam:        3.40 cm 1.69 cm/m  RA Area:     9.05 cm LA Vol (A2C):   51.0 ml 25.34 ml/m RA Volume:   15.10 ml 7.50 ml/m LA Vol (A4C):   39.9 ml 19.82 ml/m LA Biplane Vol: 46.1 ml 22.90 ml/m  AORTIC VALVE AV Area (Vmax):    2.70 cm AV Area (Vmean):   2.60 cm AV  Area (VTI):     2.51 cm AV Vmax:           121.00 cm/s AV Vmean:          76.500 cm/s AV VTI:            0.223 m AV Peak Grad:      5.9 mmHg AV Mean Grad:      3.0 mmHg LVOT Vmax:         104.00 cm/s LVOT Vmean:        63.300 cm/s LVOT VTI:  0.178 m LVOT/AV VTI ratio: 0.80  AORTA Ao Root diam: 3.40 cm Ao Asc diam:  3.30 cm MITRAL VALVE MV Area (PHT): 3.99 cm     SHUNTS MV Decel Time: 190 msec     Systemic VTI:  0.18 m MV E velocity: 53.30 cm/s   Systemic Diam: 2.00 cm MV A velocity: 104.00 cm/s MV E/A ratio:  0.51 Candee Furbish MD Electronically signed by Candee Furbish MD Signature Date/Time: 01/07/2021/3:43:01 PM    Final    [place] EKG SITE RITE  Result Date: 01/10/2021 If Site Rite image not attached, placement could not be confirmed due to current cardiac rhythm.

## 2021-01-20 NOTE — Progress Notes (Signed)
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 ventricular systolic function is normal. The right ventricular size is normal.  3. The mitral valve is normal in structure. Trivial mitral valve regurgitation. No evidence of mitral  stenosis.  4. The aortic valve is normal in structure. Aortic valve regurgitation is trivial. No aortic stenosis is present.  5. The inferior vena cava is normal in size with greater than 50% respiratory variability, suggesting right atrial pressure of 3 mmHg. FINDINGS  Left Ventricle: Left ventricular ejection fraction, by estimation, is 60 to 65%. The left ventricle has normal function. The left ventricle has no regional wall motion abnormalities. The left ventricular internal cavity size was normal in size. There is  mild left  ventricular hypertrophy. Left ventricular diastolic parameters are consistent with Grade I diastolic dysfunction (impaired relaxation). Right Ventricle: The right ventricular size is normal. No increase in right ventricular wall thickness. Right ventricular systolic function is normal. Left Atrium: Left atrial size was normal in size. Right Atrium: Right atrial size was normal in size. Pericardium: There is no evidence of pericardial effusion. Mitral Valve: The mitral valve is normal in structure. Trivial mitral valve regurgitation. No evidence of mitral valve stenosis. Tricuspid Valve: The tricuspid valve is normal in structure. Tricuspid valve regurgitation is trivial. No evidence of tricuspid stenosis. Aortic Valve: The aortic valve is normal in structure. Aortic valve regurgitation is trivial. No aortic stenosis is present. Aortic valve mean gradient measures 3.0 mmHg. Aortic valve peak gradient measures 5.9 mmHg. Aortic valve area, by VTI measures 2.51 cm. Pulmonic Valve: The pulmonic valve was normal in structure. Pulmonic valve regurgitation is not visualized. No evidence of pulmonic stenosis. Aorta: The aortic root is normal in size and structure. Venous: The inferior vena cava is normal in size with greater than 50% respiratory variability, suggesting right atrial pressure of 3 mmHg. IAS/Shunts: No atrial level shunt detected by color flow Doppler.  LEFT VENTRICLE PLAX 2D LVIDd:         4.00 cm  Diastology LVIDs:         2.50 cm  LV e' medial:    5.33 cm/s LV PW:         1.20 cm  LV E/e' medial:  10.0 LV IVS:        1.20 cm  LV e' lateral:   7.94 cm/s LVOT diam:     2.00 cm  LV E/e' lateral: 6.7 LV SV:         56 LV SV Index:   28 LVOT Area:     3.14 cm  RIGHT VENTRICLE RV S prime:     20.10 cm/s LEFT ATRIUM             Index       RIGHT ATRIUM          Index LA diam:        3.40 cm 1.69 cm/m  RA Area:     9.05 cm LA Vol (A2C):   51.0 ml 25.34 ml/m RA Volume:   15.10 ml 7.50 ml/m LA Vol (A4C):   39.9  ml 19.82 ml/m LA Biplane Vol: 46.1 ml 22.90 ml/m  AORTIC VALVE AV Area (Vmax):    2.70 cm AV Area (Vmean):   2.60 cm AV Area (VTI):     2.51 cm AV Vmax:           121.00 cm/s AV Vmean:          76.500 cm/s AV VTI:            0.223 m AV Peak Grad:      5.9 mmHg AV Mean Grad:      3.0 mmHg LVOT Vmax:  104.00 cm/s LVOT Vmean:        63.300 cm/s LVOT VTI:          0.178 m LVOT/AV VTI ratio: 0.80  AORTA Ao Root diam: 3.40 cm Ao Asc diam:  3.30 cm MITRAL VALVE MV Area (PHT): 3.99 cm     SHUNTS MV Decel Time: 190 msec     Systemic VTI:  0.18 m MV E velocity: 53.30 cm/s   Systemic Diam: 2.00 cm MV A velocity: 104.00 cm/s MV E/A ratio:  0.51 Candee Furbish MD Electronically signed by Candee Furbish MD Signature Date/Time: 01/07/2021/3:43:01 PM    Final    [place] EKG SITE RITE  Result Date: 01/10/2021 If Site Rite image not attached, placement could not be confirmed due to current cardiac rhythm.

## 2021-01-20 NOTE — Plan of Care (Signed)
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
## 2021-01-21 MED ORDER — AMLODIPINE BESYLATE 5 MG PO TABS
5.0000 mg | ORAL_TABLET | Freq: Every day | ORAL | Status: DC
  Administered 2021-01-22: 5 mg via ORAL
  Filled 2021-01-21 (×2): qty 1

## 2021-01-21 NOTE — Progress Notes (Signed)
OT Cancellation Note  Patient Details Name: Marc Rubio MRN: [id number] DOB: [birth year]   Cancelled Treatment:    Reason Eval/Treat Not Completed: Patient declined, no reason specified. Patient reports already having worked with therapy this date. Explanation given about the difference between PT and OT. Patient continued to refuse.   [name] OTR/L Supplemental OT, Department of rehab services [phone]  [name] R H. 01/21/2021, 12:39 PM

## 2021-01-21 NOTE — Progress Notes (Signed)
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 (impaired relaxation).  2. Right ventricular systolic function is normal. The right ventricular size is normal.  3. The mitral valve is normal in  structure. Trivial mitral valve regurgitation. No evidence of mitral stenosis.  4. The aortic valve is normal in structure. Aortic valve regurgitation is trivial. No aortic stenosis is present.  5. The inferior vena cava is normal in size with greater than 50% respiratory variability, suggesting right atrial pressure of 3 mmHg. FINDINGS  Left Ventricle: Left ventricular ejection fraction, by estimation, is 60 to 65%. The left ventricle has normal function. The left ventricle has no regional wall motion abnormalities. The left ventricular internal  cavity size was normal in size. There is  mild left ventricular hypertrophy. Left ventricular diastolic parameters are consistent with Grade I diastolic dysfunction (impaired relaxation). Right Ventricle: The right ventricular size is normal. No increase in right ventricular wall thickness. Right ventricular systolic function is normal. Left Atrium: Left atrial size was normal in size. Right Atrium: Right atrial size was normal in size. Pericardium: There is no evidence of pericardial effusion. Mitral Valve: The mitral valve is normal in structure. Trivial mitral valve regurgitation. No evidence of mitral valve stenosis. Tricuspid Valve: The tricuspid valve is normal in structure. Tricuspid valve regurgitation is trivial. No evidence of tricuspid stenosis. Aortic Valve: The aortic valve is normal in structure. Aortic valve regurgitation is trivial. No aortic stenosis is present. Aortic valve mean gradient measures 3.0 mmHg. Aortic valve peak gradient measures 5.9 mmHg. Aortic valve area, by VTI measures 2.51 cm. Pulmonic Valve: The pulmonic valve was normal in structure. Pulmonic valve regurgitation is not visualized. No evidence of pulmonic stenosis. Aorta: The aortic root is normal in size and structure. Venous: The inferior vena cava is normal in size with greater than 50% respiratory variability, suggesting right atrial pressure of 3 mmHg. IAS/Shunts: No atrial level shunt detected by color flow Doppler.  LEFT VENTRICLE PLAX 2D LVIDd:         4.00 cm  Diastology LVIDs:         2.50 cm  LV e' medial:    5.33 cm/s LV PW:         1.20 cm  LV E/e' medial:  10.0 LV IVS:        1.20 cm  LV e' lateral:   7.94 cm/s LVOT diam:     2.00 cm  LV E/e' lateral: 6.7 LV SV:         56 LV SV Index:   28 LVOT Area:     3.14 cm  RIGHT VENTRICLE RV S prime:     20.10 cm/s LEFT ATRIUM             Index       RIGHT ATRIUM          Index LA diam:        3.40 cm 1.69 cm/m  RA Area:     9.05 cm LA Vol (A2C):   51.0 ml 25.34 ml/m RA  Volume:   15.10 ml 7.50 ml/m LA Vol (A4C):   39.9 ml 19.82 ml/m LA Biplane Vol: 46.1 ml 22.90 ml/m  AORTIC VALVE AV Area (Vmax):    2.70 cm AV Area (Vmean):   2.60 cm AV Area (VTI):     2.51 cm AV Vmax:           121.00 cm/s AV Vmean:          76.500 cm/s AV VTI:            0.223 m AV Peak Grad:      5.9 mmHg AV Mean Grad:  3.0 mmHg LVOT Vmax:         104.00 cm/s LVOT Vmean:        63.300 cm/s LVOT VTI:          0.178 m LVOT/AV VTI ratio: 0.80  AORTA Ao Root diam: 3.40 cm Ao Asc diam:  3.30 cm MITRAL VALVE MV Area (PHT): 3.99 cm     SHUNTS MV Decel Time: 190 msec     Systemic VTI:  0.18 m MV E velocity: 53.30 cm/s   Systemic Diam: 2.00 cm MV A velocity: 104.00 cm/s MV E/A ratio:  0.51 Candee Furbish MD Electronically signed by Candee Furbish MD Signature Date/Time: 01/07/2021/3:43:01 PM    Final    [place] EKG SITE RITE  Result Date: 01/10/2021 If Site Rite image not attached, placement could not be confirmed due to current cardiac rhythm.

## 2021-01-21 NOTE — Progress Notes (Signed)
Patient refuses to have vital signs taken tonight, states "he is fine" even after educating importance. Will try to take again later.

## 2021-01-21 NOTE — Progress Notes (Addendum)
Physical Therapy Treatment Patient Details Name: Marc Rubio MRN: [id number] DOB: Oct 10, 1945 Today's Date: 01/21/2021    History of Present Illness 75 yo male with onset of L side back pain was admitted, has concern for intra abd infection with sepsis, noted aortic infection, ileus. 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 COVID(+) 5/9. PMHx: abd aortic injury, endovasc stent graft repair, cancer of tonsil, dysphagia, PEG tube, chronic ileus, DM, L wrist gout, CAD, pulm fibrosis    PT Comments    Pt agreed to work with PT but his engagement and investment in activities is limited. Will continue to work toward incr mobility. Continue to recommend SNF.    Follow Up Recommendations  SNF     Equipment Recommendations  Hospital bed;Rolling walker with 5" wheels    Recommendations for Other Services       Precautions / Restrictions Precautions Precautions: Fall    Mobility  Bed Mobility Overal bed mobility: Needs Assistance Bed Mobility: Supine to Sit;Sit to Sidelying     Supine to sit: Max assist;HOB elevated   Sit to sidelying: Mod assist General bed mobility comments: Assist to elevate trunk into sitting. Assist to bring legs back up into bed returning to supine    Transfers                 General transfer comment: Pt declined attempts to stand  Ambulation/Gait                 Stairs             Wheelchair Mobility    Modified Rankin (Stroke Patients Only)       Balance   Sitting-balance support: Bilateral upper extremity supported;Feet supported Sitting balance-Leahy Scale: Fair Sitting balance - Comments: preferring to use BIL UE's                                    Cognition Arousal/Alertness: Awake/alert Behavior During Therapy: Flat affect Overall Cognitive Status: Within Functional Limits for tasks assessed                                 General Comments: appears functional but pt reticent to  engage      Exercises General Exercises - Lower Extremity Long Arc Quad: AROM;5 reps;Both;Seated    General Comments        Pertinent Vitals/Pain Pain Assessment: Faces Faces Pain Scale: Hurts even more Pain Location: low back, L wrist Pain Descriptors / Indicators: Sore;Grimacing;Guarding Pain Intervention(s): Limited activity within patient's tolerance;Premedicated before session;Repositioned    Home Living                      Prior Function            PT Goals (current goals can now be found in the care plan section) Acute Rehab PT Goals Patient Stated Goal: to go home Progress towards PT goals: Not progressing toward goals - comment    Frequency    Min 2X/week      PT Plan Current plan remains appropriate    Co-evaluation              AM-PAC PT "6 Clicks" Mobility   Outcome Measure  Help needed turning from your back to your side while in a flat bed without using bedrails?: A Little Help needed moving  from lying on your back to sitting on the side of a flat bed without using bedrails?: A Lot Help needed moving to and from a bed to a chair (including a wheelchair)?: Total Help needed standing up from a chair using your arms (e.g., wheelchair or bedside chair)?: Total Help needed to walk in hospital room?: Total Help needed climbing 3-5 steps with a railing? : Total 6 Click Score: 9    End of Session   Activity Tolerance: Patient limited by pain;Other (comment) (Pt self limiting) Patient left: with call bell/phone within reach;in bed;with bed alarm set Nurse Communication: Mobility status PT Visit Diagnosis: Muscle weakness (generalized) (M62.81);Other abnormalities of gait and mobility (R26.89);Pain Pain - part of body:  (back)     Time: 3149-7026 PT Time Calculation (min) (ACUTE ONLY): 28 min  Charges:  $Therapeutic Activity: 23-37 mins                     [place] Pager [phone] Office  [place] 01/21/2021, 1:49 PM

## 2021-01-21 NOTE — TOC Progression Note (Addendum)
Transition of Care [facility]) - Progression Note    Patient Details  Name: Marc Rubio MRN: [id number] Date of Birth: 08-May-1946  [facility] CM/SW Contact  [name], LCSW Phone Number: 01/21/2021, 3:48 PM  Clinical Narrative:   CSW LM with Carollee Leitz at [facility] to confirm admit date of Thursday.  Updated clinicals faxed to [facility] for authorization.  CSW received call from pt ex wife [name] asking about plan for DC.  She was updated.  1610: TC Carollee Leitz, she will plan on Thursday admit.     Expected Discharge Plan: [place] Barriers to Discharge: Continued Medical Work up  Expected Discharge Plan and Services Expected Discharge Plan: [place] Choice: [place] arrangements for the past 2 months: Single Family Home Expected Discharge Date: 01/13/21                         HH Arranged: PT,OT [place] Agency: [place] Date [facility] Agency Contacted: 01/07/21 Time [place]: 2010 Representative spoke with at [place]: [place] ([place]) Interventions    Readmission Risk Interventions No flowsheet data found.

## 2021-01-22 MED ORDER — ALLOPURINOL 300 MG PO TABS
300.0000 mg | ORAL_TABLET | Freq: Every day | ORAL | Status: DC
  Administered 2021-01-23: 300 mg
  Filled 2021-01-22: qty 1

## 2021-01-22 MED ORDER — ONDANSETRON HCL 4 MG/2ML IJ SOLN: 4.0000 mg | Freq: Four times a day (QID) | INTRAMUSCULAR | Status: DC | PRN

## 2021-01-22 MED ORDER — COLCHICINE 0.6 MG PO TABS
0.6000 mg | ORAL_TABLET | Freq: Every day | ORAL | Status: DC
  Administered 2021-01-23: 0.6 mg
  Filled 2021-01-22: qty 1

## 2021-01-22 MED ORDER — COLCHICINE 0.6 MG PO TABS: 0.6000 mg | ORAL_TABLET | Freq: Every day | ORAL | Status: AC

## 2021-01-22 MED ORDER — PROSOURCE TF PO LIQD: 45.0000 mL | Freq: Every day | ORAL | Status: AC

## 2021-01-22 MED ORDER — OSMOLITE 1.5 CAL PO LIQD: 474.0000 mL | Freq: Three times a day (TID) | ORAL | 0 refills | Status: AC

## 2021-01-22 MED ORDER — PANTOPRAZOLE SODIUM 40 MG PO PACK: 40.0000 mg | PACK | Freq: Every day | ORAL | Status: AC

## 2021-01-22 MED ORDER — ALLOPURINOL 300 MG PO TABS: 300.0000 mg | ORAL_TABLET | Freq: Every day | ORAL | Status: AC

## 2021-01-22 MED ORDER — PANTOPRAZOLE SODIUM 40 MG PO PACK
40.0000 mg | PACK | Freq: Every day | ORAL | Status: DC
  Administered 2021-01-23: 40 mg
  Filled 2021-01-22: qty 20

## 2021-01-22 MED ORDER — ZINC SULFATE 220 (50 ZN) MG PO CAPS
220.0000 mg | ORAL_CAPSULE | Freq: Every day | ORAL | Status: DC
  Administered 2021-01-23: 220 mg
  Filled 2021-01-22: qty 1

## 2021-01-22 MED ORDER — ACETAMINOPHEN 650 MG RE SUPP: 650.0000 mg | Freq: Four times a day (QID) | RECTAL | Status: DC | PRN

## 2021-01-22 MED ORDER — TRAMADOL HCL 50 MG PO TABS
50.0000 mg | ORAL_TABLET | Freq: Four times a day (QID) | ORAL | Status: DC | PRN
  Administered 2021-01-22: 50 mg
  Filled 2021-01-22: qty 1

## 2021-01-22 MED ORDER — AMLODIPINE BESYLATE 5 MG PO TABS: 5.0000 mg | ORAL_TABLET | Freq: Every day | ORAL | Status: AC

## 2021-01-22 MED ORDER — HYDROCOD POLST-CPM POLST ER 10-8 MG/5ML PO SUER: 5.0000 mL | Freq: Two times a day (BID) | ORAL | Status: DC | PRN

## 2021-01-22 MED ORDER — ACETAMINOPHEN 325 MG PO TABS: 650.0000 mg | ORAL_TABLET | Freq: Four times a day (QID) | ORAL | Status: DC | PRN

## 2021-01-22 MED ORDER — GUAIFENESIN-DM 100-10 MG/5ML PO SYRP: 10.0000 mL | ORAL_SOLUTION | ORAL | Status: DC | PRN

## 2021-01-22 MED ORDER — ASCORBIC ACID 500 MG PO TABS
500.0000 mg | ORAL_TABLET | Freq: Every day | ORAL | Status: DC
  Administered 2021-01-23: 500 mg
  Filled 2021-01-22: qty 1

## 2021-01-22 MED ORDER — AMLODIPINE BESYLATE 5 MG PO TABS
5.0000 mg | ORAL_TABLET | Freq: Every day | ORAL | Status: DC
  Filled 2021-01-22: qty 1

## 2021-01-22 MED ORDER — ONDANSETRON HCL 4 MG PO TABS: 4.0000 mg | ORAL_TABLET | Freq: Four times a day (QID) | ORAL | Status: DC | PRN

## 2021-01-22 MED ORDER — TRAMADOL HCL 50 MG PO TABS: 50.0000 mg | ORAL_TABLET | Freq: Four times a day (QID) | ORAL | 0 refills | Status: AC | PRN

## 2021-01-22 NOTE — Progress Notes (Signed)
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 65%. The left ventricle has normal function. The left ventricle has no regional wall motion abnormalities. There is mild left ventricular hypertrophy. Left ventricular diastolic parameters are consistent with Grade I diastolic dysfunction (impaired relaxation).  2. Right ventricular systolic function is normal. The right ventricular size is normal.  3. The mitral valve is normal in structure. Trivial mitral valve regurgitation. No evidence of mitral stenosis.  4. The aortic valve is normal in structure. Aortic valve regurgitation is trivial. No aortic stenosis is present.  5. The inferior vena cava is normal in size with greater than  50% respiratory variability, suggesting right atrial pressure of 3 mmHg. FINDINGS  Left Ventricle: Left ventricular ejection fraction, by estimation, is 60 to 65%. The left ventricle has normal function. The left ventricle has no regional wall motion abnormalities. The left ventricular internal cavity size was normal in size. There is  mild left ventricular hypertrophy. Left ventricular diastolic parameters are consistent with Grade I diastolic dysfunction (impaired relaxation). Right Ventricle: The right ventricular size is normal. No increase in right ventricular wall thickness. Right ventricular systolic function is normal. Left Atrium: Left atrial size was normal in size. Right Atrium: Right atrial size was normal in size. Pericardium: There is no evidence of pericardial effusion. Mitral Valve: The mitral valve is normal in structure. Trivial mitral valve regurgitation. No evidence of mitral valve stenosis. Tricuspid Valve: The tricuspid valve is normal in structure. Tricuspid valve regurgitation is trivial. No evidence of tricuspid stenosis. Aortic Valve: The aortic valve is normal in structure. Aortic valve regurgitation is trivial. No aortic stenosis is present. Aortic valve mean gradient measures 3.0 mmHg. Aortic valve peak gradient measures 5.9 mmHg. Aortic valve area, by VTI measures 2.51 cm. Pulmonic Valve: The pulmonic valve was normal in structure. Pulmonic valve regurgitation is not visualized. No evidence of pulmonic stenosis. Aorta: The aortic root is normal in size and structure. Venous: The inferior vena cava is normal in size with greater than 50% respiratory variability, suggesting right atrial pressure of 3 mmHg. IAS/Shunts: No atrial level shunt detected by color flow Doppler.  LEFT VENTRICLE PLAX 2D LVIDd:         4.00 cm  Diastology LVIDs:         2.50 cm  LV e' medial:    5.33 cm/s LV PW:         1.20 cm  LV E/e' medial:  10.0 LV IVS:        1.20 cm  LV e' lateral:   7.94 cm/s LVOT diam:      2.00 cm  LV E/e' lateral: 6.7 LV SV:         56 LV SV Index:   28 LVOT Area:     3.14 cm  RIGHT VENTRICLE RV S prime:     20.10 cm/s LEFT ATRIUM             Index       RIGHT ATRIUM          Index LA diam:        3.40 cm 1.69 cm/m  RA Area:     9.05 cm LA Vol (A2C):   51.0 ml 25.34 ml/m RA Volume:   15.10 ml 7.50 ml/m LA Vol (A4C):   39.9 ml 19.82 ml/m LA Biplane Vol: 46.1 ml 22.90 ml/m  AORTIC VALVE AV Area (Vmax):    2.70 cm AV Area (Vmean):   2.60 cm AV Area (VTI):     2.51 cm  AV Vmax:           121.00 cm/s AV Vmean:          76.500 cm/s AV VTI:            0.223 m AV Peak Grad:      5.9 mmHg AV Mean Grad:      3.0 mmHg LVOT Vmax:         104.00 cm/s LVOT Vmean:        63.300 cm/s LVOT VTI:          0.178 m LVOT/AV VTI ratio: 0.80  AORTA Ao Root diam: 3.40 cm Ao Asc diam:  3.30 cm MITRAL VALVE MV Area (PHT): 3.99 cm     SHUNTS MV Decel Time: 190 msec     Systemic VTI:  0.18 m MV E velocity: 53.30 cm/s   Systemic Diam: 2.00 cm MV A velocity: 104.00 cm/s MV E/A ratio:  0.51 Candee Furbish MD Electronically signed by Candee Furbish MD Signature Date/Time: 01/07/2021/3:43:01 PM    Final    [place] EKG SITE RITE  Result Date: 01/10/2021 If Site Rite image not attached, placement could not be confirmed due to current cardiac rhythm.

## 2021-01-22 NOTE — TOC Progression Note (Signed)
Transition of Care [facility]) - Progression Note    Patient Details  Name: Marc Rubio MRN: [id number] Date of Birth: 08-Apr-1946  Transition of Care [facility]) CM/SW Contact  [name], LCSW Phone Number: 01/22/2021, 9:51 AM  Clinical Narrative:   CSW received insurance authorization from [place]:  5 days starting 5/19 with review on 5/23 with Toni Arthurs.  Auth ID [id number].CSW spoke with pt by phone and confirmed plan for transfer tomorrow.    Expected Discharge Plan: [place] Barriers to Discharge: Continued Medical Work up  Expected Discharge Plan and Services Expected Discharge Plan: [place] Choice: [place] arrangements for the past 2 months: Single Family Home Expected Discharge Date: 01/13/21                         HH Arranged: PT,OT [place] Agency: [place] Date [facility] Agency Contacted: 01/07/21 Time [place]: [id number] Representative spoke with at [place]: [place] ([place]) Interventions    Readmission Risk Interventions No flowsheet data found.

## 2021-01-22 NOTE — Discharge Summary (Addendum)
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 hours. No results for input(s):  CHOL, HDL, LDLCALC, TRIG, CHOLHDL, LDLDIRECT in the last 72 hours. No results for input(s): TSH, T4TOTAL, T3FREE, THYROIDAB in the last 72 hours.  Invalid input(s): FREET3 No results for input(s): VITAMINB12, FOLATE, FERRITIN, TIBC, IRON, RETICCTPCT in the last 72 hours. Coags: No results for input(s): INR in the last 72 hours.  Invalid input(s): PT Microbiology: Recent Results (from the past 240 hour(s))  Resp Panel by RT-PCR (Flu A&B, Covid) Nasopharyngeal Swab     Status: Abnormal   Collection Time: 01/13/21 11:25 AM   Specimen: Nasopharyngeal Swab; Nasopharyngeal(NP) swabs in vial transport medium  Result Value Ref Range Status   SARS Coronavirus 2 by RT PCR POSITIVE (A) NEGATIVE Final    Comment: RESULT CALLED TO, READ BACK BY AND VERIFIED WITH: RN Desiree Hane [phone] MLM (NOTE) SARS-CoV-2 target nucleic acids  are DETECTED.  The SARS-CoV-2 RNA is generally detectable in upper respiratory specimens during the acute phase of infection. Positive results are indicative of the presence of the identified virus, but do not rule out bacterial infection or co-infection with other pathogens not detected by the test. Clinical correlation with patient history and other diagnostic information is necessary to determine patient infection status. The expected result is Negative.  Fact Sheet for Patients: BloggerCourse.com  Fact Sheet for Healthcare Providers: SeriousBroker.it  This test is not yet approved or cleared by the [place] FDA and  has been authorized for detection and/or diagnosis of SARS-CoV-2 by FDA under an Emergency Use Authorization (EUA).  This EUA will remain in effect (meaning this test can be used)  for the duration of  the COVID-19 declaration under Section 564(b)(1) of the Act, 21 U.S.C. section 360bbb-3(b)(1), unless the authorization is terminated or revoked sooner.     Influenza A by PCR NEGATIVE NEGATIVE Final   Influenza B by PCR NEGATIVE NEGATIVE Final    Comment: (NOTE) The Xpert Xpress SARS-CoV-2/FLU/RSV plus assay is intended as an aid in the diagnosis of influenza from Nasopharyngeal swab specimens and should not be used as a sole basis for treatment. Nasal washings and aspirates are unacceptable for Xpert Xpress SARS-CoV-2/FLU/RSV testing.  Fact Sheet for Patients: BloggerCourse.com  Fact Sheet for Healthcare Providers: SeriousBroker.it  This test is not yet approved or cleared by the [place] FDA and has been authorized for detection and/or diagnosis of SARS-CoV-2 by FDA under an Emergency Use Authorization (EUA). This EUA will remain in effect (meaning this test can be used) for the duration of the COVID-19 declaration under Section 564(b)(1) of the Act,  21 U.S.C. section 360bbb-3(b)(1), unless the authorization is terminated or revoked.  Performed at [facility], [address] [place], [place] [address]   SARS CORONAVIRUS 2 (TAT 6-24 HRS) Nasopharyngeal Nasopharyngeal Swab     Status: Abnormal   Collection Time: 01/16/21  8:30 AM   Specimen: Nasopharyngeal Swab  Result Value Ref Range Status   SARS Coronavirus 2 POSITIVE (A) NEGATIVE Final    Comment: (NOTE) SARS-CoV-2 target nucleic acids are DETECTED.  The SARS-CoV-2 RNA is generally detectable in upper and lower respiratory specimens during the acute phase of infection. Positive results are indicative of the presence of SARS-CoV-2 RNA. Clinical correlation with patient history and other diagnostic information is  necessary to determine patient infection status. Positive results do not rule out bacterial infection or co-infection with other viruses.  The expected result is Negative.  Fact Sheet for Patients: HairSlick.no  Fact Sheet for Healthcare Providers: quierodirigir.com  This test is not yet  approved or cleared by the [place] and  has been authorized for detection and/or diagnosis of SARS-CoV-2 by FDA under an Emergency Use Authorization (EUA). This EUA will remain  in effect (meaning this test can be used) for the duration of the COVID-19 declaration under Section 564(b)(1) of the Act, 21 U. S.C. section 360bbb-3(b)(1), unless the authorization is terminated or revoked sooner.   Performed at [facility], [place] [address] [place], [place] [address]     FURTHER DISCHARGE INSTRUCTIONS:  Get Medicines reviewed and adjusted: Please take all your medications with you for your next visit with your Primary MD  Laboratory/radiological data: Please request your Primary MD to go over all hospital tests and procedure/radiological results at the follow up, please ask your Primary MD to get all  Hospital records sent to his/her office.  In some cases, they will be blood work, cultures and biopsy results pending at the time of your discharge. Please request that your primary care M.D. goes through all the records of your hospital data and follows up on these results.  Also Note the following: If you experience worsening of your admission symptoms, develop shortness of breath, life threatening emergency, suicidal or homicidal thoughts you must seek medical attention immediately by calling 911 or calling your MD immediately  if symptoms less severe.  You must read complete instructions/literature along with all the possible adverse reactions/side effects for all the Medicines you take and that have been prescribed to you. Take any new Medicines after you have completely understood and accpet all the possible adverse reactions/side effects.   Do not drive when taking Pain medications or sleeping medications (Benzodaizepines)  Do not take more than prescribed Pain, Sleep and Anxiety Medications. It is not advisable to combine anxiety,sleep and pain medications without talking with your primary care practitioner  Special Instructions: If you have smoked or chewed Tobacco  in the last 2 yrs please stop smoking, stop any regular Alcohol  and or any Recreational drug use.  Wear Seat belts while driving.  Please note: You were cared for by a hospitalist during your hospital stay. Once you are discharged, your primary care physician will handle any further medical issues. Please note that NO REFILLS for any discharge medications will be authorized once you are discharged, as it is imperative that you return to your primary care physician (or establish a relationship with a primary care physician if you do not have one) for your post hospital discharge needs so that they can reassess your need for medications and monitor your lab values.  Total Time spent coordinating discharge including counseling,  education and face to face time equals 35 minutes.  SignedOren Binet 01/23/2021 9:48 AM

## 2021-01-22 NOTE — TOC Transition Note (Addendum)
Transition of Care [facility]) - CM/SW Discharge Note   Patient Details  Name: Marc Rubio MRN: [id number] Date of Birth: 1946/06/21  [facility] CM/SW Contact:  [name], LCSW Phone Number: 01/22/2021, 10:04 AM   Clinical Narrative:   Pt will discharge tomorrow, 01/23/21, to [facility], Room 137B.  RN call report to [phone].  Please confirm with CSW tomorrow that Corey Harold has been called.       Barriers to Discharge: Continued Medical Work up   Patient Goals and CMS Choice Patient states their goals for this hospitalization and ongoing recovery are:: take care of myself CMS Medicare.gov Compare Post Acute Care list provided to:: Patient Choice offered to / list presented to : Patient  Discharge Placement                       Discharge Plan and Services     Post Acute Care Choice: Home Health                    HH Arranged: PT,OT [facility] Agency: [place] Date [place]: 01/07/21 Time [place]: [id number] Representative spoke with at [place]: [place] ([place]) Interventions     Readmission Risk Interventions No flowsheet data found.
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
## 2021-01-23 NOTE — Progress Notes (Signed)
Report called. Staff nurse hung up phone before clarifying name and new room number.  Facility called back-Tammy/Tolondra possible nurses pt will go to 129B.

## 2021-01-23 NOTE — TOC Transition Note (Signed)
Transition of Care [facility]) - CM/SW Discharge Note   Patient Details  Name: Brant Peets MRN: [id number] Date of Birth: 24-Nov-1945  Transition of Care [facility]) CM/SW Contact:  Benard Halsted, LCSW Phone Number: 01/23/2021, 11:00 AM   Clinical Narrative:    Patient will DC to: [facility] Anticipated DC date: 01/23/21 Family notified: Pt notifying family Transport by: Corey Harold   Per MD patient ready for DC to [facility]. RN to call report prior to discharge [phone] room 129b). RN, patient, patient's family, and facility notified of DC. Discharge Summary and FL2 sent to facility. DC packet on chart. Ambulance transport requested for patient.   CSW will sign off for now as social work intervention is no longer needed. Please consult [place] again if new needs arise.      Final next level of care: Skilled Nursing Facility Barriers to Discharge: Barriers Resolved   Patient Goals and CMS Choice Patient states their goals for this hospitalization and ongoing recovery are:: take care of myself CMS Medicare.gov Compare Post Acute Care list provided to:: Patient Choice offered to / list presented to : Patient  Discharge Placement   Existing PASRR number confirmed : 01/23/21          Patient [facility] bed at: [facility] Patient to be transferred to facility by: [place] Name of family member notified: Pt notified family Patient and family notified of of transfer: 01/23/21  Discharge Plan and Services     Post Acute Care Choice: [place]: PT,OT [facility] Agency: [place] Date [facility] Agency Contacted: 01/07/21 Time [place]: [phone] Representative spoke with at [place]: [place] ([place]) Interventions     Readmission Risk Interventions No flowsheet data found.

## 2021-01-31 ENCOUNTER — Ambulatory Visit (INDEPENDENT_AMBULATORY_CARE_PROVIDER_SITE_OTHER): Payer: Medicare Other | Admitting: Family

## 2021-01-31 ENCOUNTER — Encounter: Payer: Self-pay | Admitting: Family

## 2021-01-31 ENCOUNTER — Inpatient Hospital Stay: Payer: Medicare Other | Admitting: Family

## 2021-01-31 ENCOUNTER — Other Ambulatory Visit: Payer: Self-pay

## 2021-01-31 VITALS — BP 140/82 | HR 102 | Temp 97.2°F | Resp 20

## 2021-01-31 DIAGNOSIS — B999 Unspecified infectious disease: Secondary | ICD-10-CM | POA: Diagnosis not present

## 2021-01-31 DIAGNOSIS — R7881 Bacteremia: Secondary | ICD-10-CM

## 2021-01-31 NOTE — Patient Instructions (Signed)
Nice to see you.  Continue with Penicillin through 6/13 as planned.   Remove PICC on 6/13 after completion of treatment.  Start Keflex 500 mg twice daily for chronic suppression.   Plan for follow up in 2 months or sooner if needed.   Have a great day and stay safe!

## 2021-01-31 NOTE — Assessment & Plan Note (Addendum)
Mr. 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  Deemed not a surgical candidate.  Has been receiving penicillin G with no adverse side effects.  We will continue current penicillin G through 02/17/2021 and then transition to oral cephalexin 500 mg twice daily for chronic suppression given retention of the aortic graft.  Written orders provided to remove PICC line at the end of treatment.  Discussed plan of care with family.  Plan for follow-up in 2 months or sooner if needed.
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
## 2021-02-18 ENCOUNTER — Inpatient Hospital Stay: Payer: Medicare Other | Admitting: Family

## 2021-03-28 ENCOUNTER — Encounter: Payer: Self-pay | Admitting: Family

## 2021-03-28 ENCOUNTER — Ambulatory Visit (INDEPENDENT_AMBULATORY_CARE_PROVIDER_SITE_OTHER): Payer: Medicare Other | Admitting: Family

## 2021-03-28 ENCOUNTER — Other Ambulatory Visit: Payer: Self-pay

## 2021-03-28 VITALS — BP 112/71 | HR 75 | Temp 98.3°F | Wt 206.2 lb

## 2021-03-28 DIAGNOSIS — T827XXD Infection and inflammatory reaction due to other cardiac and vascular devices, implants and grafts, subsequent encounter: Secondary | ICD-10-CM | POA: Diagnosis not present

## 2021-03-28 DIAGNOSIS — R7881 Bacteremia: Secondary | ICD-10-CM

## 2021-03-28 DIAGNOSIS — T827XXA Infection and inflammatory reaction due to other cardiac and vascular devices, implants and grafts, initial encounter: Secondary | ICD-10-CM | POA: Insufficient documentation

## 2021-03-28 MED ORDER — CEPHALEXIN 500 MG PO CAPS: 500.0000 mg | ORAL_CAPSULE | Freq: Two times a day (BID) | ORAL | 5 refills | Status: DC

## 2021-03-28 NOTE — Progress Notes (Signed)
Subjective:    Patient ID: Marc Rubio, male    DOB: 10-Jan-1946, 75 y.o.   MRN: [id number]  Chief Complaint  Patient presents with   Follow-up    Intra-abdominal infection    HPI:  Marc Rubio is a 75 y.o. 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   Marc Rubio has been taking his Keflex daily as prescribed with no adverse side effects or missed doses since his last office visit.  He continues to have low back pain.  Was seen by primary care, however was not his specific primary care provider and was unable to obtain pain medication.  Denies fevers or chills.   No Known Allergies    Outpatient Medications Prior to Visit  Medication Sig Dispense Refill   allopurinol (ZYLOPRIM) 300 MG tablet Place 1 tablet (300 mg total) into feeding tube daily.     amLODipine (NORVASC) 5 MG tablet Place 1 tablet (5 mg total) into feeding tube daily.     aspirin 81 MG chewable tablet Place 81 mg into feeding tube daily.     colchicine 0.6 MG tablet Place 1 tablet (0.6 mg total) into feeding tube daily.     ferrous sulfate 220 (44 Fe) MG/5ML solution Place 220 mg into feeding tube daily.     Nutritional Supplements (FEEDING SUPPLEMENT, OSMOLITE 1.5 CAL,) LIQD Place 474 mLs into feeding tube 3 (three) times daily.  0   Nutritional Supplements (FEEDING SUPPLEMENT, PROSOURCE TF,) liquid Place 45 mLs into feeding tube daily.     pantoprazole sodium (PROTONIX) 40 mg/20 mL PACK Place 20 mLs (40 mg total) into feeding tube daily. 30 mL    polyethylene glycol powder (GLYCOLAX/MIRALAX) 17 GM/SCOOP powder Place 17 g into feeding tube daily.     traMADol (ULTRAM) 50 MG tablet Place 1 tablet (50 mg total) into feeding tube every 6 (six) hours as needed for moderate pain. 10 tablet 0   cephALEXin (KEFLEX) 500 MG capsule Take by mouth.      penicillin G potassium 12 Million Units in dextrose 5 % 500 mL Inject 12 Million Units into the vein every 12 (twelve) hours. (Patient not taking: Reported on 03/28/2021)     No facility-administered medications prior to visit.     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  Respiratory:  Negative for cough, chest tightness, shortness of breath and wheezing.   Cardiovascular:  Negative for chest pain.  Gastrointestinal:  Negative for abdominal pain, diarrhea, nausea and vomiting.     Objective:    BP 112/71   Pulse 75   Temp 98.3 F (36.8 C) (Oral)   Wt 206 lb 3.2 oz (93.5 kg)  BMI 27.97 kg/m  Nursing note and vital signs reviewed.  Physical Exam Constitutional:      General: He is not in acute distress.    Appearance: He is well-developed.  Cardiovascular:     Rate and Rhythm: Normal rate and regular rhythm.     Heart sounds: Normal heart sounds.  Pulmonary:     Effort: Pulmonary effort is normal.     Breath sounds: Normal breath sounds.  Skin:    General: Skin is warm and dry.  Neurological:     Mental Status: He is alert and oriented to person, place, and time.  Psychiatric:        Behavior: Behavior normal.        Thought Content: Thought content normal.        Judgment: Judgment normal.     No flowsheet data found.     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 He will continue on lifelong suppressive therapy as he was not a surgical candidate. Currently without systemic symptoms. Continue current dose of Keflex. He may continue with care through his primary care provider. Follow up with ID as needed.        Relevant Medications   cephALEXin (KEFLEX) 500 MG capsule     Other   Bacteremia - Primary     I have discontinued Marc Rubio's penicillin G potassium 12 Million Units in dextrose 5 % 500 mL. I have also changed his cephALEXin. Additionally, I am having him maintain his aspirin, ferrous sulfate, polyethylene glycol powder, feeding supplement (OSMOLITE 1.5 CAL), feeding supplement (PROSource TF), colchicine, allopurinol, traMADol, pantoprazole sodium, and amLODipine.   Meds ordered this encounter  Medications   cephALEXin (KEFLEX) 500 MG capsule    Sig: Take 1 capsule (500 mg total) by mouth 2 (two) times daily.    Dispense:  60 capsule    Refill:  5    Order Specific Question:   Supervising Provider    Answer:   Carlyle Basques [[id number]]     Follow-up: As needed.    Terri Piedra, MSN, FNP-C Nurse Practitioner [facility] [place] number: [phone]

## 2021-03-28 NOTE — Assessment & Plan Note (Signed)
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 He will continue on lifelong suppressive therapy as he was not a surgical candidate. Currently without systemic symptoms. Continue current dose of Keflex. He may continue with care through his primary care provider. Follow up with ID as needed.

## 2021-03-28 NOTE — Patient Instructions (Signed)
Nice to see you.  We will continue your current dose of Keflex indefinitely.   Refills have been sent to the pharmacy.  Recommend continued follow up with your primary care provider and follow up with ID as needed.   Have a great day and stay safe!

## 2022-06-13 ENCOUNTER — Encounter (HOSPITAL_BASED_OUTPATIENT_CLINIC_OR_DEPARTMENT_OTHER): Payer: Self-pay | Admitting: Emergency Medicine

## 2022-06-13 ENCOUNTER — Other Ambulatory Visit: Payer: Self-pay

## 2022-06-13 ENCOUNTER — Emergency Department (HOSPITAL_BASED_OUTPATIENT_CLINIC_OR_DEPARTMENT_OTHER)
Admission: EM | Admit: 2022-06-13 | Discharge: 2022-06-13 | Disposition: A | Discharge status: Home / Self Care | Payer: Medicare Other | Attending: Emergency Medicine | Admitting: Emergency Medicine

## 2022-06-13 DIAGNOSIS — Z7982 Long term (current) use of aspirin: Secondary | ICD-10-CM | POA: Diagnosis not present

## 2022-06-13 DIAGNOSIS — T85598A Other mechanical complication of other gastrointestinal prosthetic devices, implants and grafts, initial encounter: Secondary | ICD-10-CM

## 2022-06-13 DIAGNOSIS — K9423 Gastrostomy malfunction: Secondary | ICD-10-CM | POA: Insufficient documentation

## 2022-06-13 NOTE — ED Triage Notes (Signed)
Pt reports feeding tube came out around 1400; has been in for a year, pt uses it 3x per day

## 2022-06-13 NOTE — ED Notes (Signed)
Pt verbally aggressive, reports he is leaving the ED, not open to communicate with this RN, EDP Belfi MD aware, pt leaving AMA.

## 2022-06-13 NOTE — ED Provider Notes (Signed)
[facility] EMERGENCY DEPARTMENT Provider Note   CSN: [id number] Arrival date & time: 06/13/22  1734     History  Chief Complaint  Patient presents with   Feeding Tube Displaced    Marc Rubio is a 76 y.o. male.  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  He receives all of his nutrition through his G-tube.  Its been in for several years.  He says occasionally it will come out and have to have it replaced.  It came out today around 230.  He denies any pain or bleeding to the area.  He just request that it be placed back in.       Home Medications Prior to Admission medications   Medication Sig Start Date End Date Taking? Authorizing Provider  allopurinol (ZYLOPRIM) 300 MG tablet Place 1 tablet (300 mg total) into feeding tube daily. 01/23/21   Ghimire, Henreitta Leber, MD  amLODipine (NORVASC) 5 MG tablet Place 1 tablet (5 mg total) into feeding tube daily. 01/23/21   Ghimire, Henreitta Leber, MD  aspirin 81 MG chewable tablet Place 81 mg into feeding tube daily. 07/11/17   [provider]  cephALEXin (KEFLEX) 500 MG capsule Take 1 capsule (500 mg total) by mouth 2 (two) times daily. 03/28/21   Golden Circle, FNP  colchicine 0.6 MG tablet Place 1 tablet (0.6 mg total) into feeding tube daily. 01/23/21   Ghimire, Henreitta Leber, MD  ferrous sulfate 220 (44 Fe) MG/5ML solution Place 220 mg into feeding tube daily. 11/29/20   [provider]  Nutritional Supplements (FEEDING SUPPLEMENT, OSMOLITE 1.5 CAL,) LIQD Place 474 mLs into feeding tube 3 (three) times daily. 01/22/21   Ghimire, Henreitta Leber, MD  Nutritional Supplements (FEEDING SUPPLEMENT, PROSOURCE TF,) liquid Place 45 mLs into feeding tube daily. 01/23/21   Ghimire, Henreitta Leber, MD  pantoprazole sodium (PROTONIX) 40 mg/20 mL PACK Place 20 mLs (40 mg total) into feeding tube daily. 01/23/21   Ghimire, Henreitta Leber, MD  polyethylene glycol powder  (GLYCOLAX/MIRALAX) 17 GM/SCOOP powder Place 17 g into feeding tube daily. 07/15/17   [provider]  traMADol (ULTRAM) 50 MG tablet Place 1 tablet (50 mg total) into feeding tube every 6 (six) hours as needed for moderate pain. 01/22/21   Ghimire, Henreitta Leber, MD      Allergies    Patient has no known allergies.    Review of Systems   Review of Systems  Constitutional:  Negative for fever.  Gastrointestinal:  Negative for abdominal pain, nausea and vomiting.    Physical Exam Updated Vital Signs BP 117/66   Pulse 63   Temp 98.3 F (36.8 C) (Oral)   Resp 20   Ht 6' (1.829 m)   Wt 81.6 kg   SpO2 94%   BMI 24.41 kg/m  Physical Exam Cardiovascular:     Rate and Rhythm: Normal rate.  Pulmonary:     Effort: Pulmonary effort is normal.  Abdominal:     Tenderness: There is no abdominal tenderness.     Comments: Stoma from G-tube in place.  There is no drainage or signs of infection.  Skin:    General: Skin is warm and dry.  Neurological:     Mental Status: He is alert and oriented to person, place, and time.     ED Results / Procedures / Treatments   Labs (all labs ordered are listed, but only abnormal results  are displayed) Labs Reviewed - No data to display  EKG None  Radiology No results found.  Procedures Procedures    Medications Ordered in ED Medications - No data to display  ED Course/ Medical Decision Making/ A&P                           Medical Decision Making  Patient presents after his G-tube fell out.  Unfortunately, we do not have replacement G-tubes.  However I did advise him that I can place a Foley catheter temporarily that he can do his feedings through and he can arrange on Monday to have the G-tube replaced through his primary care doctor.  He got fairly upset at this and said "I am in a go to [facility] where they can put a real tube in".  I did advise him that it would be better for [place] to go ahead and put a temporary tube and  now because the longer it stays out, the less likely they will be able to replace it without further intervention.  I also reiterated that he would still be able to use the tube for feeding.  He became angry and said "just let me out of here".  He left AMA.  Final Clinical Impression(s) / ED Diagnoses Final diagnoses:  Feeding tube dysfunction, initial encounter    Rx / DC Orders ED Discharge Orders     None         Malvin Johns, MD 06/13/22 [phone]

## 2022-06-13 NOTE — Discharge Instructions (Signed)
You need to have the feeding tube fixed today.  The longer it stays out, the less likely that it would be able to be replaced without further intervention.

## 2022-08-25 ENCOUNTER — Other Ambulatory Visit: Payer: Self-pay | Admitting: Family

## 2023-03-18 IMAGING — DX DG ABDOMEN 1V
3 series · 3 of 3 positions shown · non-contrast
Comparison: 07/17/2017

Correlation: CT abdomen pelvis 08/11/2017

CLINICAL DATA: Shortness of breath, fever, nausea and intermittent
headache for 2 weeks

EXAM:
ABDOMEN - 1 VIEW

[abdomen kub (1 of 3)]
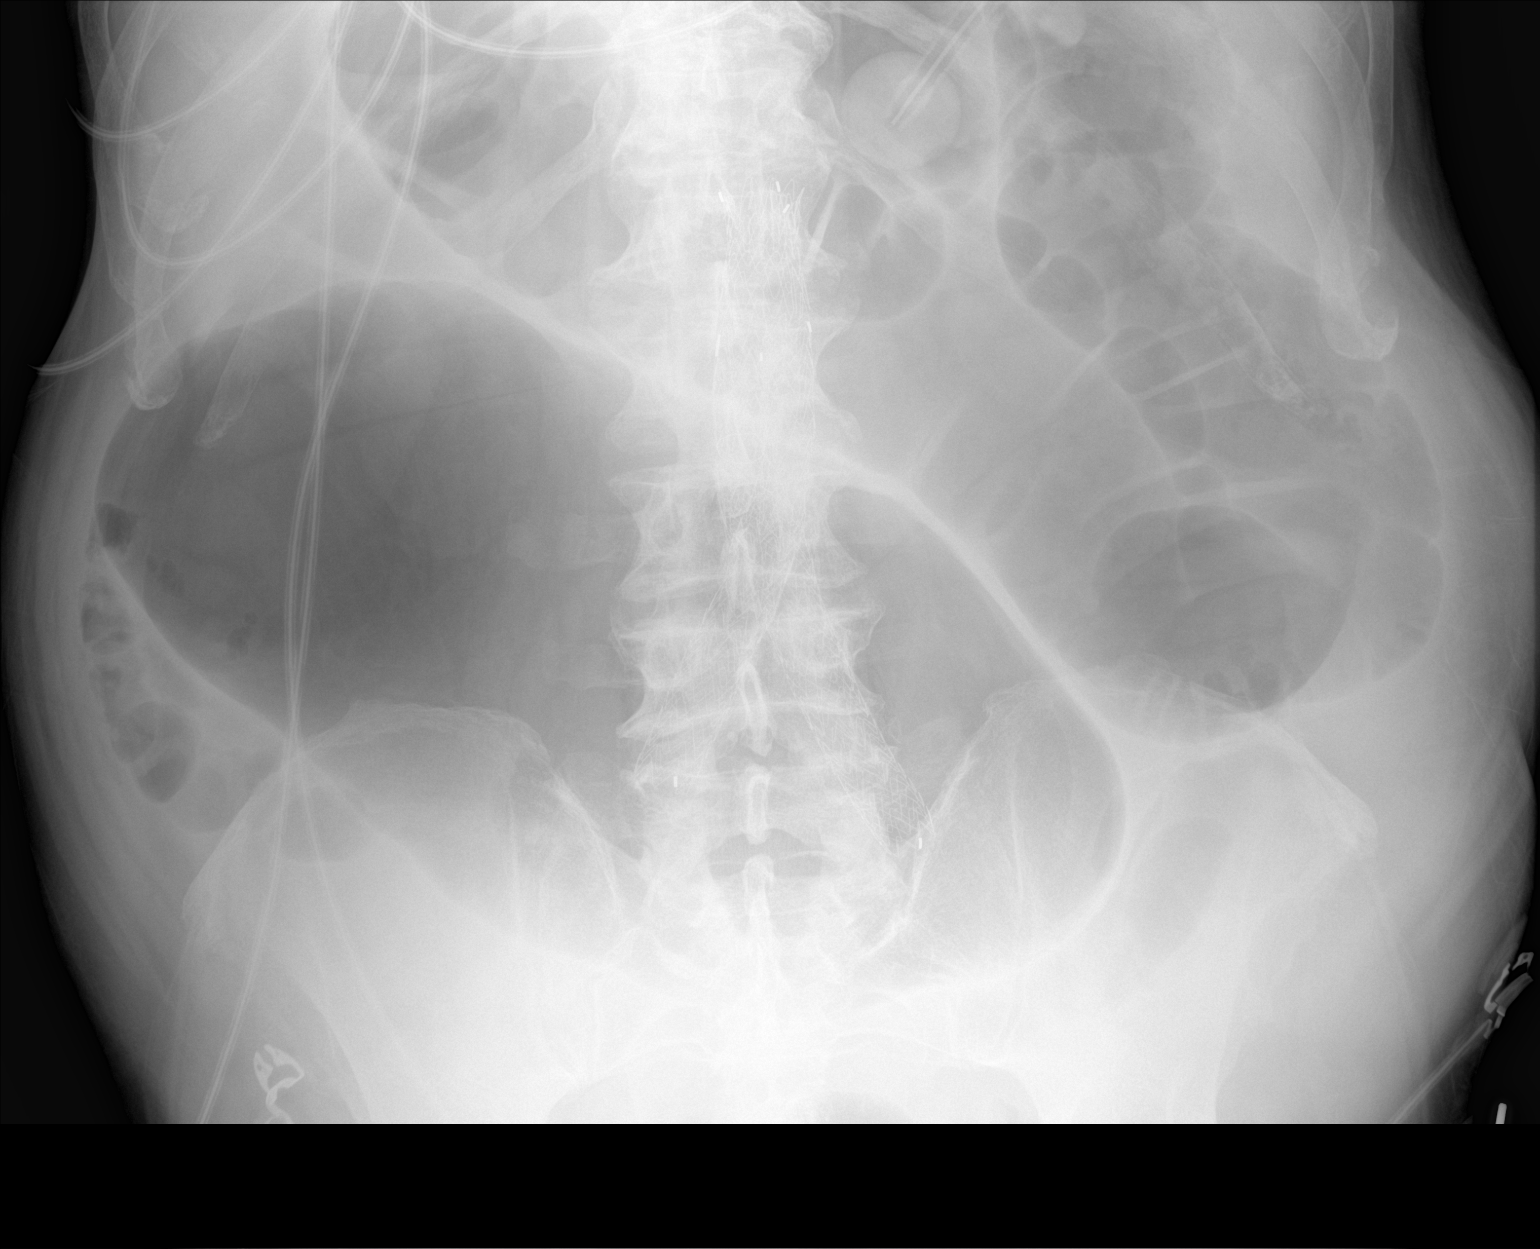

[abdomen kub (2 of 3)]
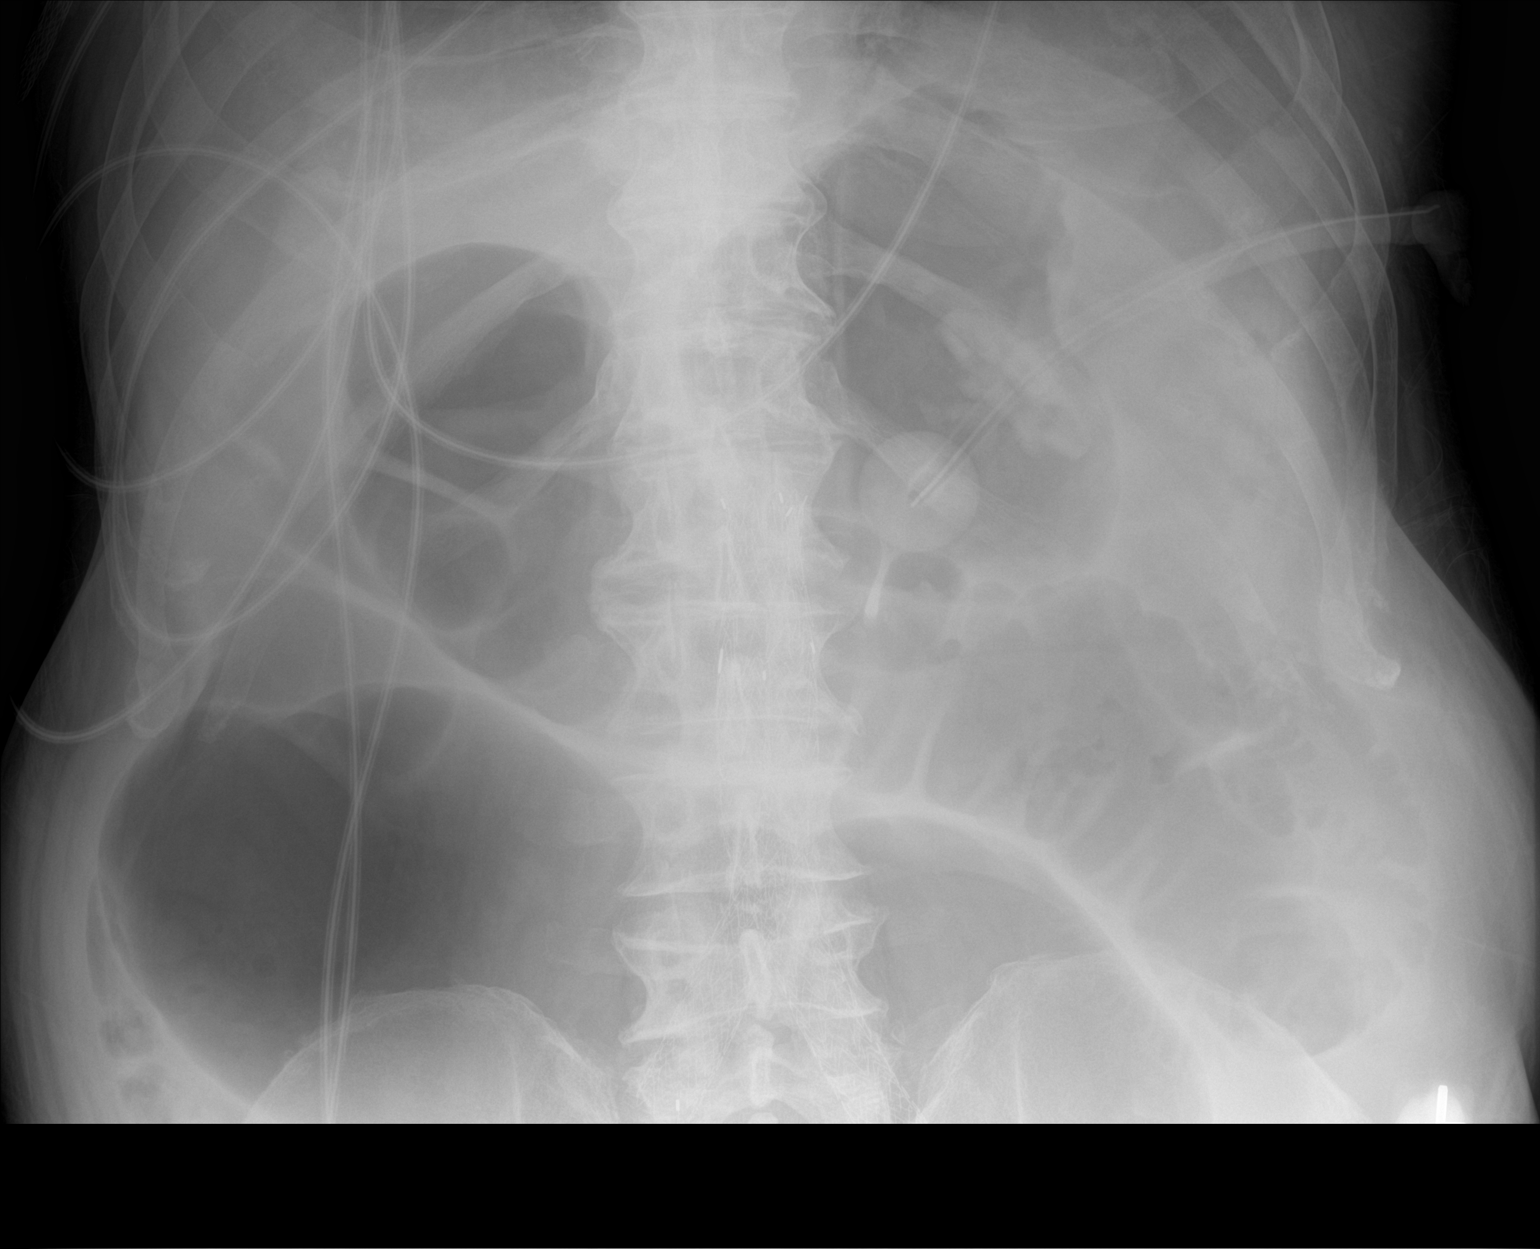

[abdomen kub (3 of 3)]
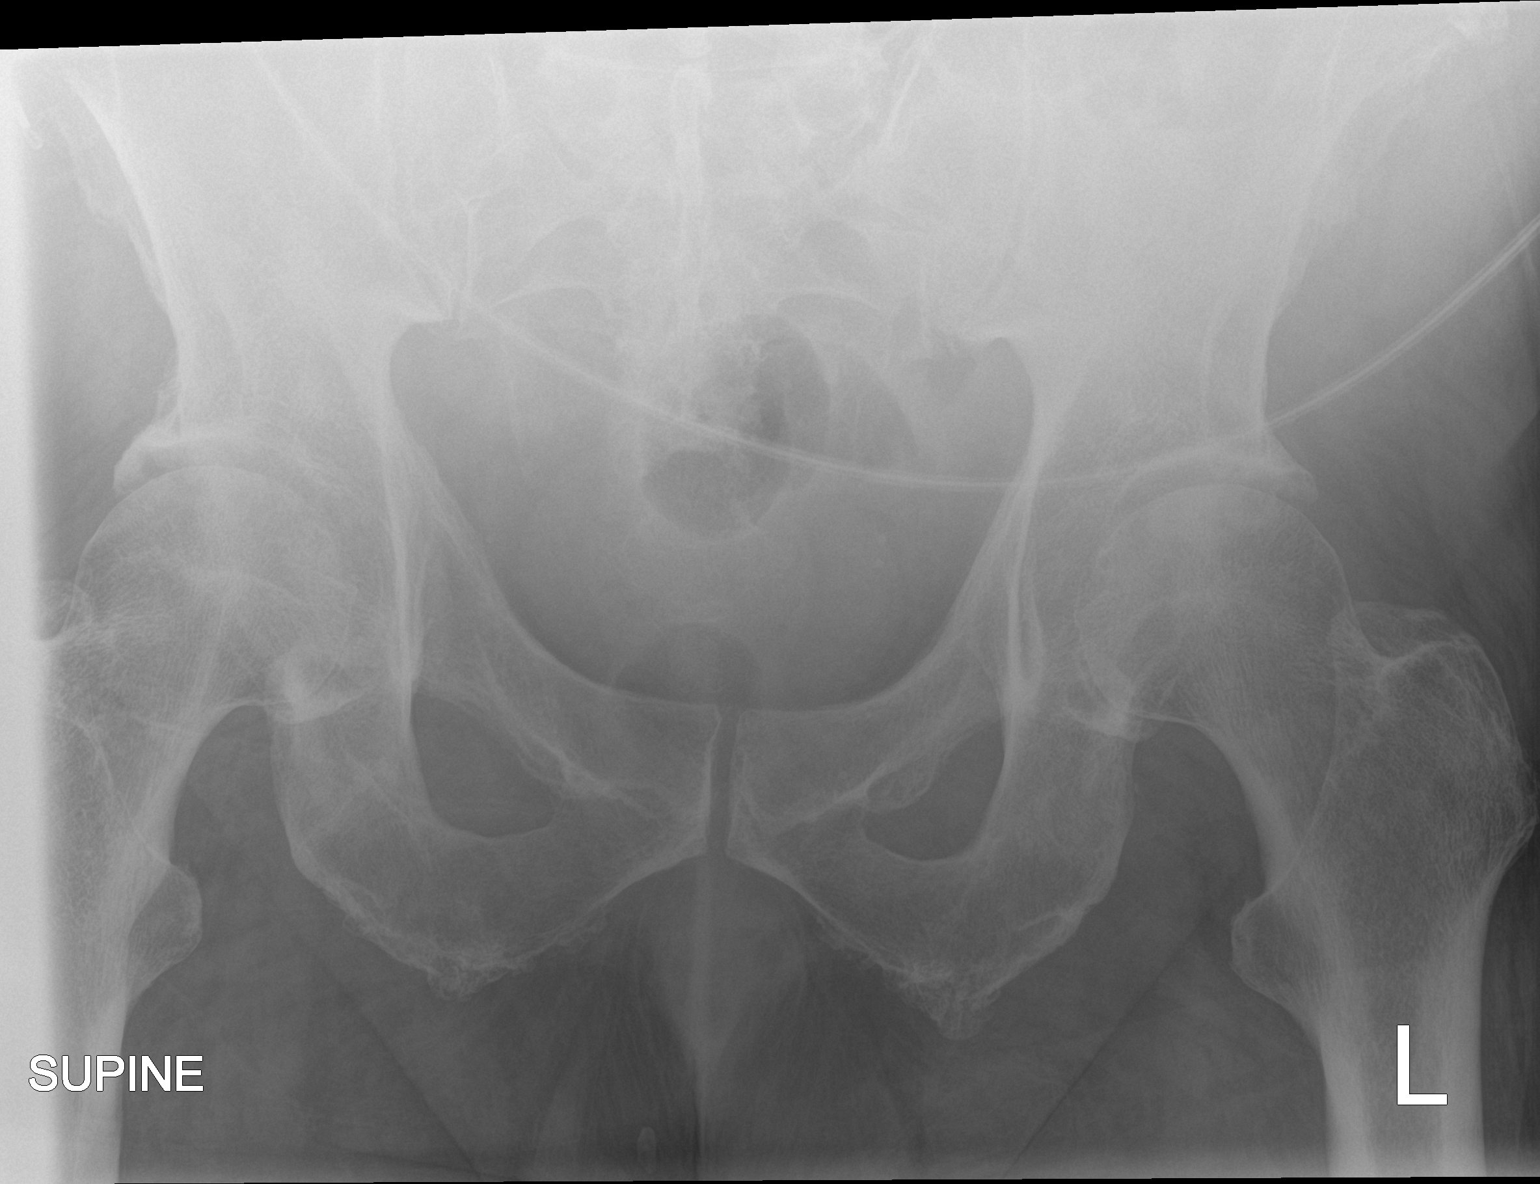

[3 of 3 positions shown; findings below may reference images not displayed]

FINDINGS: Gastrostomy tube in LEFT upper quadrant.

Colonic dilatation involving the cecum through transverse colon,
measuring up to 14.6 cm at proximal ascending colon; previously this
measured 14.8 cm in 3165.

Small amount of gas within rectum and sigmoid colon, which appear
decompressed.

No small bowel distension.

Stents identified within abdominal aorta and common iliac arteries.

Osseous demineralization with degenerative disc disease changes of
thoracolumbar spine and chronic appearing compression deformity of
L4 vertebral body.
IMPRESSION: Colonic distension of cecum through transverse colon, measuring up
to 14.6 cm at proximal ascending colon, not significantly changed
from 07/17/2017 exam; the colon was decompressed on an interval CT
study from 2713.

This likely reflects colonic ileus since no obstructing colonic
lesion was seen on the interval CT exam, which showed a decompressed
colon.

## 2023-03-18 IMAGING — CT CT ANGIO CHEST
2 of 8 series · 19 of 36 positions shown · IV contrast (Omnipaque)
Comparison: 07/11/2017

CLINICAL DATA: Shortness of breath, fever

EXAM:
CT ANGIOGRAPHY CHEST WITH CONTRAST
TECHNIQUE: Multidetector CT imaging of the chest was performed using the
standard protocol during bolus administration of intravenous
contrast. Multiplanar CT image reconstructions and MIPs were
obtained to evaluate the vascular anatomy.
CONTRAST:  100mL OMNIPAQUE IOHEXOL 350 MG/ML SOLN

[Series 6: pe coronal mpr · coronal · 0.59mm/px · 1 of 151 slices shown]
[im 76/151  mediastinal]
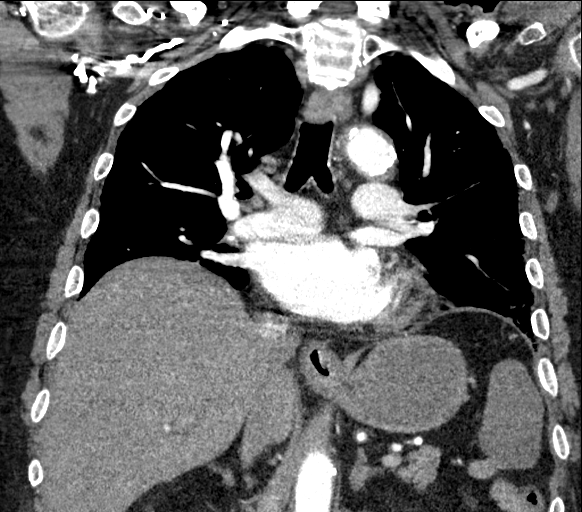

[Series 10: pe thins · axial · 0.78mm/px · z∈[-256,+13]mm · 18 of 301 slices shown]
[im 16/301  lung]
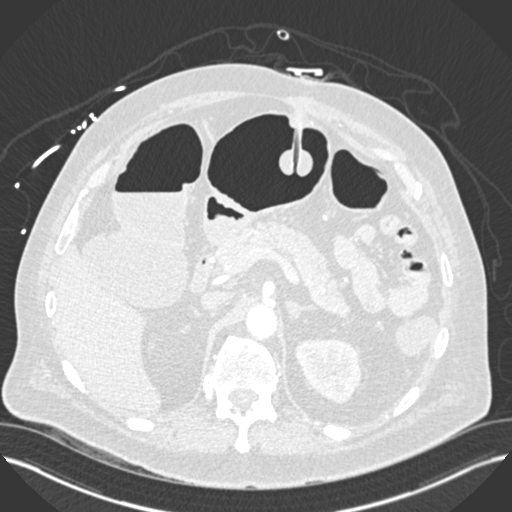
[im 32/301  mediastinal]
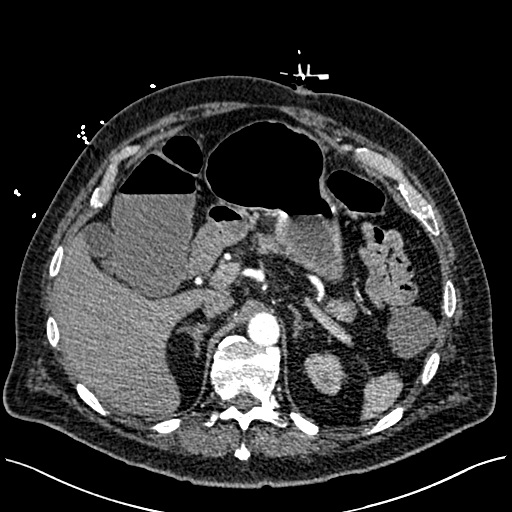
[im 48/301  lung]
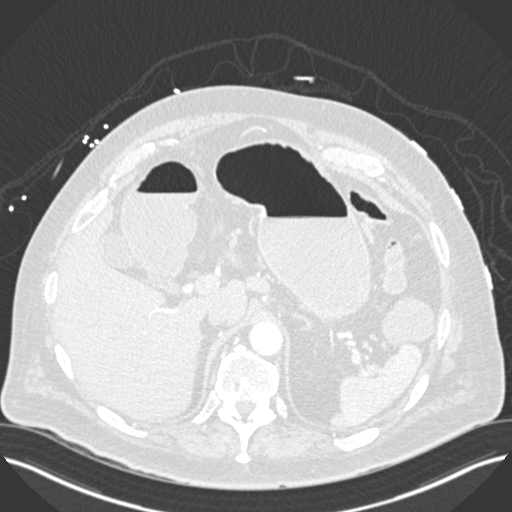
[im 64/301  mediastinal]
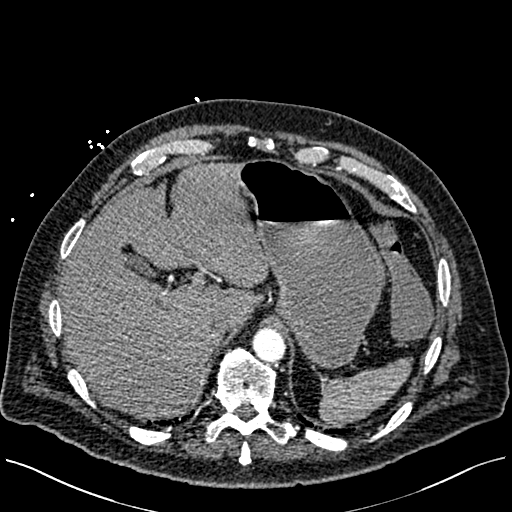
[im 79/301  lung]
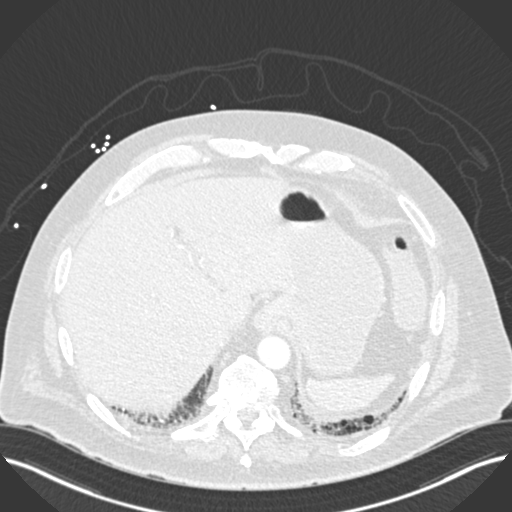
[im 95/301  mediastinal]
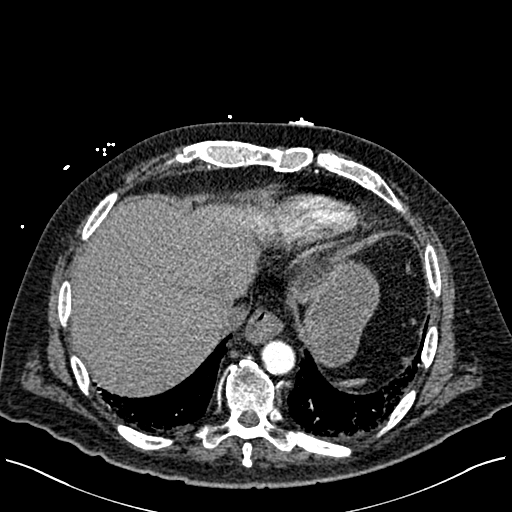
[im 111/301  lung]
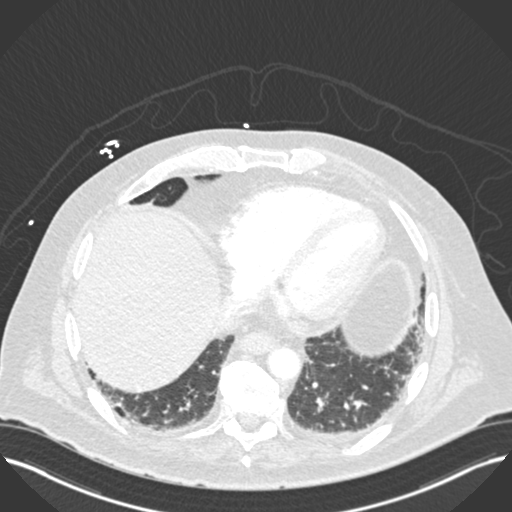
[im 127/301  mediastinal]
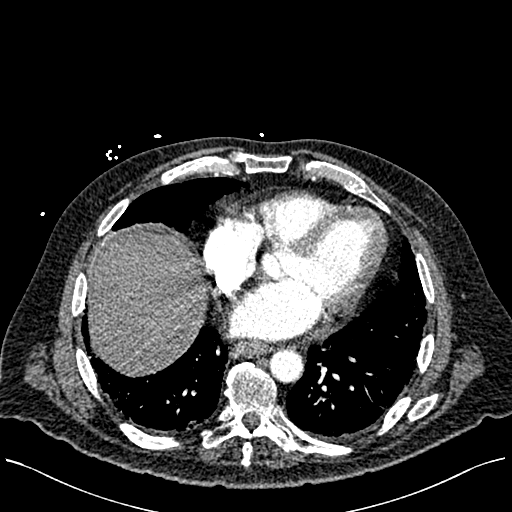
[im 143/301  lung]
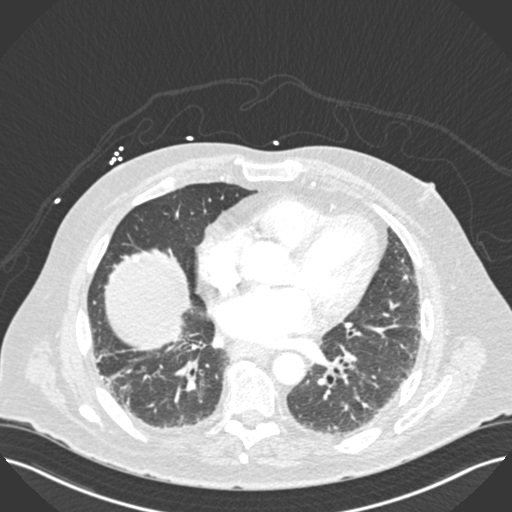
[im 158/301  mediastinal]
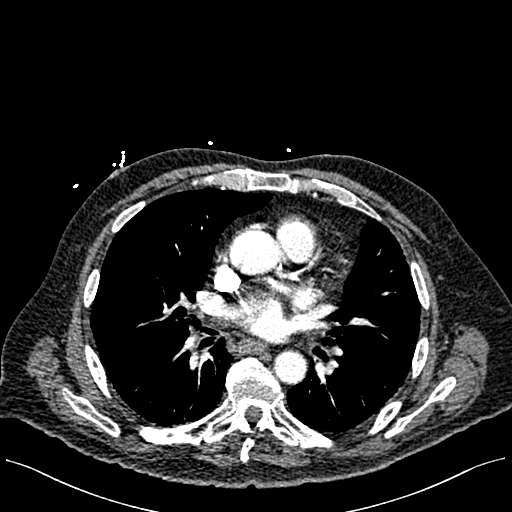
[im 174/301  lung]
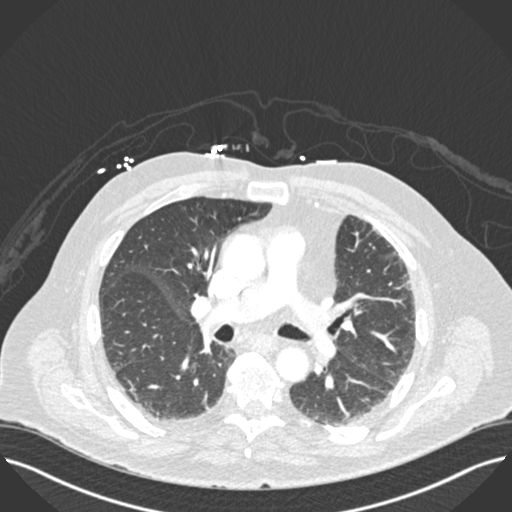
[im 190/301  mediastinal]
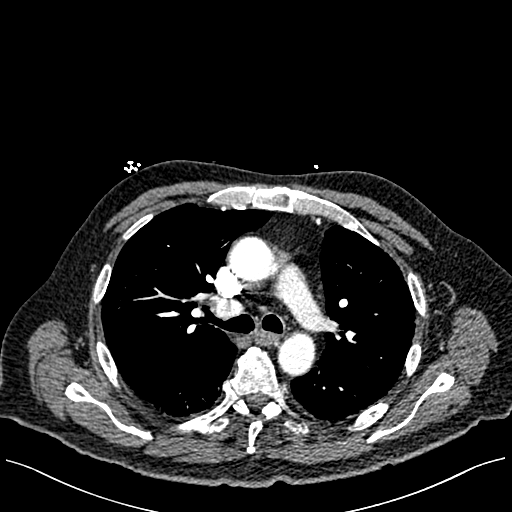
[im 206/301  lung]
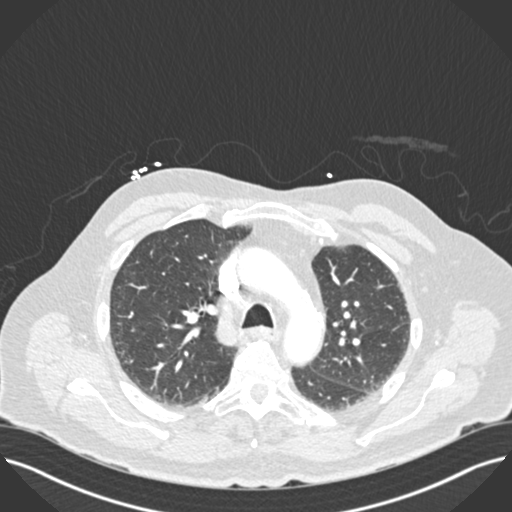
[im 222/301  mediastinal]
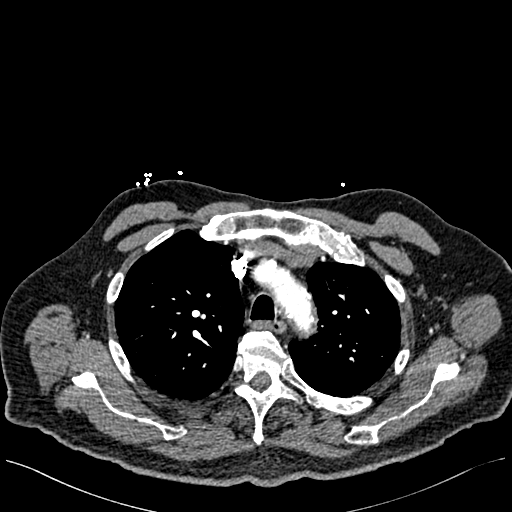
[im 237/301  lung]
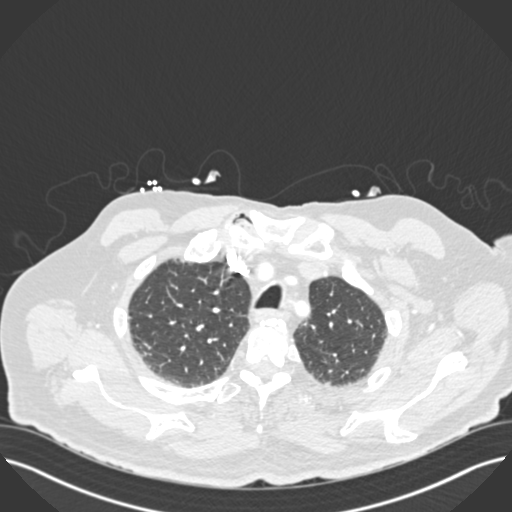
[im 253/301  mediastinal]
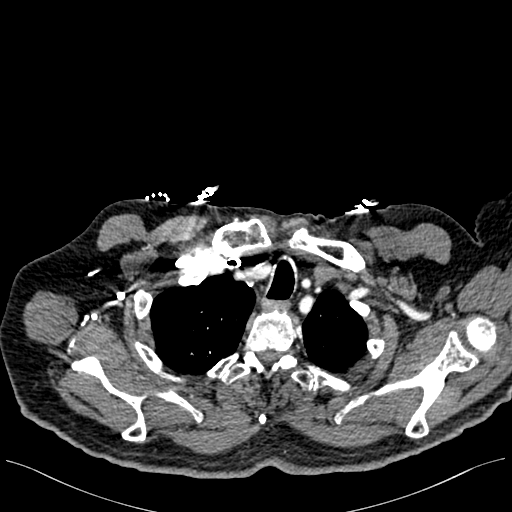
[im 269/301  lung]
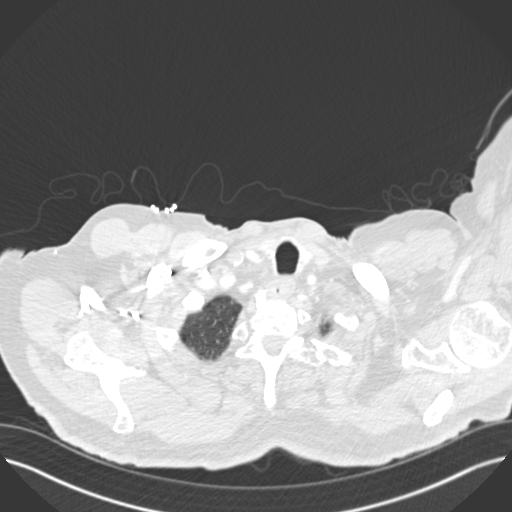
[im 285/301  mediastinal]
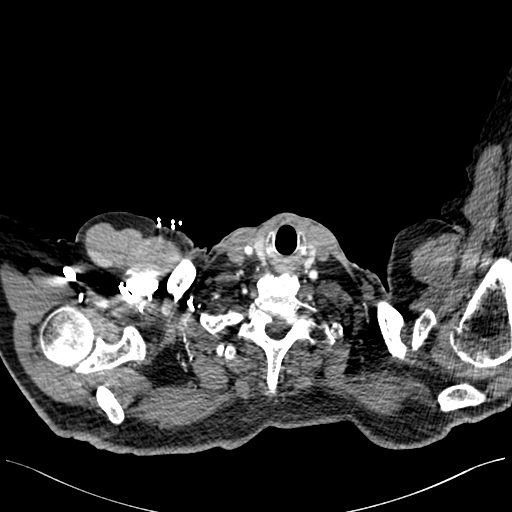

[19 of 36 positions shown; findings below may reference images not displayed]

FINDINGS: Cardiovascular: No filling defects in the pulmonary arteries to
suggest pulmonary emboli. Heart is normal size. Aorta is normal
caliber. Coronary artery and aortic calcifications.

Mediastinum/Nodes: No mediastinal, hilar, or axillary adenopathy.
Trachea and esophagus are unremarkable. Thyroid unremarkable.

Lungs/Pleura: Peripheral interstitial thickening compatible with
fibrosis, most notable in the lung bases. No confluent opacities or
effusions.

Upper Abdomen: Imaging into the upper abdomen demonstrates no acute
findings. Gastrostomy tube within the stomach.

Musculoskeletal: Chest wall soft tissues are unremarkable. No acute
bony abnormality.

Review of the MIP images confirms the above findings.
IMPRESSION: No evidence of pulmonary embolus.

Peripheral interstitial thickening/fibrosis.

Coronary artery disease.

Aortic Atherosclerosis ([id number]-404.4).

## 2024-05-08 DEATH — deceased
